# Patient Record
Sex: Male | Born: 1945
Health system: Southern US, Community
[De-identification: ages and names within clinical notes are randomized; demographics above are authoritative.]

## PROBLEM LIST (undated history)

## (undated) DIAGNOSIS — K219 Gastro-esophageal reflux disease without esophagitis: Secondary | ICD-10-CM

## (undated) DIAGNOSIS — F419 Anxiety disorder, unspecified: Secondary | ICD-10-CM

## (undated) DIAGNOSIS — E119 Type 2 diabetes mellitus without complications: Secondary | ICD-10-CM

## (undated) DIAGNOSIS — E7800 Pure hypercholesterolemia, unspecified: Secondary | ICD-10-CM

## (undated) DIAGNOSIS — I10 Essential (primary) hypertension: Secondary | ICD-10-CM

## (undated) HISTORY — DX: Essential (primary) hypertension: I10

## (undated) HISTORY — DX: Gastro-esophageal reflux disease without esophagitis: K21.9

## (undated) HISTORY — DX: Pure hypercholesterolemia, unspecified: E78.00

## (undated) HISTORY — DX: Anxiety disorder, unspecified: F41.9

## (undated) HISTORY — PX: TESTICULAR EXPLORATION: SHX5145

## (undated) HISTORY — DX: Type 2 diabetes mellitus without complications: E11.9

---

## 2014-04-23 DIAGNOSIS — H269 Unspecified cataract: Secondary | ICD-10-CM | POA: Insufficient documentation

## 2017-07-28 LAB — HM COLONOSCOPY

## 2019-05-04 LAB — HM DIABETES EYE EXAM

## 2020-03-18 ENCOUNTER — Other Ambulatory Visit: Payer: Self-pay

## 2020-03-19 ENCOUNTER — Ambulatory Visit (INDEPENDENT_AMBULATORY_CARE_PROVIDER_SITE_OTHER): Payer: Medicare (Managed Care) | Admitting: Internal Medicine

## 2020-03-19 ENCOUNTER — Encounter: Payer: Self-pay | Admitting: Internal Medicine

## 2020-03-19 VITALS — BP 140/78 | HR 60 | Temp 98.2°F | Ht 74.0 in | Wt 193.6 lb

## 2020-03-19 DIAGNOSIS — Z23 Encounter for immunization: Secondary | ICD-10-CM

## 2020-03-19 DIAGNOSIS — B351 Tinea unguium: Secondary | ICD-10-CM | POA: Insufficient documentation

## 2020-03-19 DIAGNOSIS — E119 Type 2 diabetes mellitus without complications: Secondary | ICD-10-CM

## 2020-03-19 DIAGNOSIS — I10 Essential (primary) hypertension: Secondary | ICD-10-CM

## 2020-03-19 DIAGNOSIS — E785 Hyperlipidemia, unspecified: Secondary | ICD-10-CM | POA: Diagnosis not present

## 2020-03-19 DIAGNOSIS — D539 Nutritional anemia, unspecified: Secondary | ICD-10-CM

## 2020-03-19 DIAGNOSIS — N400 Enlarged prostate without lower urinary tract symptoms: Secondary | ICD-10-CM | POA: Diagnosis not present

## 2020-03-19 DIAGNOSIS — E538 Deficiency of other specified B group vitamins: Secondary | ICD-10-CM | POA: Diagnosis not present

## 2020-03-19 DIAGNOSIS — N62 Hypertrophy of breast: Secondary | ICD-10-CM | POA: Insufficient documentation

## 2020-03-19 DIAGNOSIS — D508 Other iron deficiency anemias: Secondary | ICD-10-CM

## 2020-03-19 DIAGNOSIS — R972 Elevated prostate specific antigen [PSA]: Secondary | ICD-10-CM

## 2020-03-19 LAB — FOLATE: Folate: 8.8 ng/mL (ref >5.9)

## 2020-03-19 LAB — TSH: TSH: 1.43 u[IU]/mL (ref 0.35–4.50)

## 2020-03-19 LAB — HEPATIC FUNCTION PANEL
ALT: 7 U/L (ref 0–53)
AST: 9 U/L (ref 0–37)
Albumin: 4.2 g/dL (ref 3.5–5.2)
Alkaline Phosphatase: 76 U/L (ref 39–117)
Bilirubin, Direct: 0.1 mg/dL (ref 0.0–0.3)
Total Bilirubin: 0.6 mg/dL (ref 0.2–1.2)
Total Protein: 7.6 g/dL (ref 6.0–8.3)

## 2020-03-19 LAB — CBC WITH DIFFERENTIAL/PLATELET
Basophils Absolute: 0.1 10*3/uL (ref 0.0–0.1)
Basophils Relative: 0.7 % (ref 0.0–3.0)
Eosinophils Absolute: 0.1 10*3/uL (ref 0.0–0.7)
Eosinophils Relative: 1.5 % (ref 0.0–5.0)
HCT: 36.1 % — ABNORMAL LOW (ref 39.0–52.0)
Hemoglobin: 12.3 g/dL — ABNORMAL LOW (ref 13.0–17.0)
Lymphocytes Relative: 29.2 % (ref 12.0–46.0)
Lymphs Abs: 2.3 10*3/uL (ref 0.7–4.0)
MCHC: 33.9 g/dL (ref 30.0–36.0)
MCV: 89.8 fl (ref 78.0–100.0)
Monocytes Absolute: 0.7 10*3/uL (ref 0.1–1.0)
Monocytes Relative: 8.7 % (ref 3.0–12.0)
Neutro Abs: 4.8 10*3/uL (ref 1.4–7.7)
Neutrophils Relative %: 59.9 % (ref 43.0–77.0)
Platelets: 258 10*3/uL (ref 150.0–400.0)
RBC: 4.02 Mil/uL — ABNORMAL LOW (ref 4.22–5.81)
RDW: 14.1 % (ref 11.5–15.5)
WBC: 8 10*3/uL (ref 4.0–10.5)

## 2020-03-19 LAB — BASIC METABOLIC PANEL
BUN: 11 mg/dL (ref 6–23)
CO2: 32 mEq/L (ref 19–32)
Calcium: 9.3 mg/dL (ref 8.4–10.5)
Chloride: 100 mEq/L (ref 96–112)
Creatinine, Ser: 0.79 mg/dL (ref 0.40–1.50)
GFR: 88.53 mL/min (ref >60.00)
Glucose, Bld: 166 mg/dL — ABNORMAL HIGH (ref 70–99)
Potassium: 3.5 mEq/L (ref 3.5–5.1)
Sodium: 138 mEq/L (ref 135–145)

## 2020-03-19 LAB — LIPID PANEL
Cholesterol: 152 mg/dL (ref 0–200)
HDL: 62.5 mg/dL (ref >39.00)
LDL Cholesterol: 69 mg/dL (ref 0–99)
NonHDL: 89.02
Total CHOL/HDL Ratio: 2
Triglycerides: 101 mg/dL (ref 0.0–149.0)
VLDL: 20.2 mg/dL (ref 0.0–40.0)

## 2020-03-19 LAB — MICROALBUMIN / CREATININE URINE RATIO
Creatinine,U: 228.4 mg/dL
Microalb Creat Ratio: 1.1 mg/g (ref 0.0–30.0)
Microalb, Ur: 2.5 mg/dL — ABNORMAL HIGH (ref 0.0–1.9)

## 2020-03-19 LAB — PSA: PSA: 2.8

## 2020-03-19 LAB — VITAMIN B12: Vitamin B-12: 519 pg/mL (ref 211–911)

## 2020-03-19 LAB — HEMOGLOBIN A1C: Hgb A1c MFr Bld: 8 % — ABNORMAL HIGH (ref 4.6–6.5)

## 2020-03-19 NOTE — Progress Notes (Signed)
Subjective:  Patient ID: Stephen Factor., male    DOB: 06/20/1945  Age: 74 y.o. MRN: [id number]  CC: Hypertension, Hyperlipidemia, and Diabetes  This visit occurred during the SARS-CoV-2 public health emergency.  Safety protocols were in place, including screening questions prior to the visit, additional usage of staff PPE, and extensive cleaning of exam room while observing appropriate contact time as indicated for disinfecting solutions.   NEW TO ME  HPI Stephen Gray. presents for establishing care.  He recently moved to [place] from [place] to be near his daughter.  1.  He complains of a 36-month history of breast tenderness and enlargement.  2.  He tells me that he has a history of an elevated PSA and has nocturia about 3-4 times a night.  He tells me his urine flow is good during the day.  3.  He recently traumatized his right great toenail and remains concerned about it.  4.  He has a history of DM 2.  He is currently taking Metformin and a DPP 4 inhibitor.  5.  He reports a history of B12 deficiency.  He is not currently taking a B12 supplement.  He has no medical records with him.  No medical records are available to me.  History Stephen Gray has a past medical history of Diabetes (HCC), Hypercholesteremia, and Hypertension.   He has no past surgical history on file.   His family history is not on file.He reports that he has quit smoking. He has never used smokeless tobacco. He reports previous alcohol use. He reports previous drug use.  Outpatient Medications Prior to Visit  Medication Sig Dispense Refill  . Cholecalciferol (VITAMIN D-3) 25 MCG (1000 UT) CAPS Take by mouth.    . Multiple Vitamin (MULTIVITAMIN ADULT PO) Take by mouth.    Marland Kitchen SIMVASTATIN PO Take by mouth.    . tamsulosin (FLOMAX) 0.4 MG CAPS capsule Take 0.4 mg by mouth.    . VALSARTAN PO Take by mouth.    . metFORMIN (GLUCOPHAGE) 1000 MG tablet Take 1,000 mg by mouth 2 (two) times daily with a  meal.    . sitaGLIPtin (JANUVIA) 50 MG tablet Take 50 mg by mouth daily.     No facility-administered medications prior to visit.    ROS Review of Systems  Constitutional: Negative for appetite change, diaphoresis, fatigue and fever.  HENT: Negative.   Eyes: Negative for visual disturbance.  Respiratory: Negative for cough, chest tightness, shortness of breath and wheezing.   Cardiovascular: Negative for chest pain, palpitations and leg swelling.  Gastrointestinal: Negative for abdominal pain, constipation, diarrhea, nausea and vomiting.  Endocrine: Negative for polydipsia, polyphagia and polyuria.  Genitourinary: Negative.  Negative for difficulty urinating, dysuria, flank pain, hematuria, penile swelling, scrotal swelling, testicular pain and urgency.  Musculoskeletal: Negative.  Negative for arthralgias and myalgias.  Skin: Negative.  Negative for color change, pallor and rash.  Neurological: Negative.  Negative for dizziness, weakness, light-headedness and headaches.  Hematological: Negative for adenopathy. Does not bruise/bleed easily.  Psychiatric/Behavioral: Negative.     Objective:  BP 140/78 (BP Location: Right Arm, Patient Position: Sitting, Cuff Size: Normal)   Pulse 60   Temp 98.2 F (36.8 C) (Oral)   Ht 6\' 2"  (1.88 m)   Wt 193 lb 9.6 oz (87.8 kg)   SpO2 96%   BMI 24.86 kg/m   Physical Exam Vitals reviewed. Exam conducted with a chaperone present.  Constitutional:      Appearance: Normal appearance.  HENT:     Nose: Nose normal.     Mouth/Throat:     Mouth: Mucous membranes are moist.  Eyes:     General: No scleral icterus.    Conjunctiva/sclera: Conjunctivae normal.  Cardiovascular:     Rate and Rhythm: Regular rhythm. Bradycardia present.     Pulses:          Dorsalis pedis pulses are 1+ on the right side and 1+ on the left side.       Posterior tibial pulses are 1+ on the right side and 1+ on the left side.     Heart sounds: Normal heart sounds, S1  normal and S2 normal. No murmur heard. No gallop.      Comments: Sinus bradycardia, 53 bpm NS T wave changes No old EKG for comparison No Q waves or LVH Pulmonary:     Effort: Pulmonary effort is normal.     Breath sounds: No stridor. No wheezing, rhonchi or rales.  Chest:     Chest wall: No mass.  Breasts: Breasts are symmetrical.     Right: No swelling, bleeding, inverted nipple, mass, nipple discharge, skin change, tenderness, axillary adenopathy or supraclavicular adenopathy.     Left: No swelling, bleeding, inverted nipple, mass, nipple discharge, skin change, tenderness, axillary adenopathy or supraclavicular adenopathy.        Comments: ++ Gynecomastia Abdominal:     General: Abdomen is flat. Bowel sounds are normal. There is no distension.     Palpations: Abdomen is soft. There is no hepatomegaly, splenomegaly or mass.     Tenderness: There is no abdominal tenderness.     Hernia: No hernia is present. There is no hernia in the left inguinal area or right inguinal area.  Genitourinary:    Pubic Area: No rash.      Penis: Normal. No discharge, swelling or lesions.      Testes: Normal.        Right: Mass, tenderness or swelling not present.        Left: Mass, tenderness or swelling not present.     Epididymis:     Right: Normal. Not inflamed or enlarged.     Left: Normal. Not inflamed or enlarged.     Prostate: Enlarged (1+ smooth symm BPH). Not tender and no nodules present.     Rectum: Normal. Guaiac result negative. No mass, tenderness, anal fissure, external hemorrhoid or internal hemorrhoid. Normal anal tone.  Musculoskeletal:     Cervical back: Neck supple.     Right lower leg: No edema.     Left lower leg: No edema.  Feet:     Right foot:     Skin integrity: Skin integrity normal.     Toenail Condition: Right toenails are abnormally thick and long. Fungal disease present.    Left foot:     Skin integrity: Skin integrity normal.     Toenail Condition: Left  toenails are normal.     Comments: Rt great toenail has an accumulation of blood under the nail, the nail is thick and there is subungual debris Lymphadenopathy:     Cervical: No cervical adenopathy.     Upper Body:     Right upper body: No supraclavicular, axillary or pectoral adenopathy.     Left upper body: No supraclavicular, axillary or pectoral adenopathy.     Lower Body: No right inguinal adenopathy. No left inguinal adenopathy.  Skin:    General: Skin is warm and dry.     Coloration:  Skin is not pale.     Findings: No rash.  Neurological:     General: No focal deficit present.     Mental Status: He is alert and oriented to person, place, and time. Mental status is at baseline.  Psychiatric:        Mood and Affect: Mood normal.     Lab Results  Component Value Date   WBC 8.0 03/19/2020   HGB 12.3 (L) 03/19/2020   HCT 36.1 (L) 03/19/2020   PLT 258.0 03/19/2020   GLUCOSE 166 (H) 03/19/2020   CHOL 152 03/19/2020   TRIG 101.0 03/19/2020   HDL 62.50 03/19/2020   LDLCALC 69 03/19/2020   ALT 7 03/19/2020   AST 9 03/19/2020   NA 138 03/19/2020   K 3.5 03/19/2020   CL 100 03/19/2020   CREATININE 0.79 03/19/2020   BUN 11 03/19/2020   CO2 32 03/19/2020   TSH 1.43 03/19/2020   PSA 2.8 03/19/2020   HGBA1C 8.0 (H) 03/19/2020   MICROALBUR 2.5 (H) 03/19/2020    Assessment & Plan:   Zamire was seen today for hypertension, hyperlipidemia and diabetes.  Diagnoses and all orders for this visit:  Type 2 diabetes mellitus without complication, without long-term current use of insulin (HCC)- His blood sugar is not adequately well controlled with an A1c of 8.0%.  I recommended that he transition from the DPP 4 inhibitor to an SGLT2 inhibitor and to stay on Metformin. -     Basic metabolic panel; Future -     Microalbumin / creatinine urine ratio; Future -     Hemoglobin A1c; Future -     HM Diabetes Foot Exam -     Hemoglobin A1c -     Microalbumin / creatinine urine ratio -      Basic metabolic panel -     Dapagliflozin-metFORMIN HCl ER (XIGDUO XR) 12-998 MG TB24; Take 1 tablet by mouth daily.  Primary hypertension- His blood pressure is adequately well controlled.  Electrolytes and renal function are normal. -     CBC with Differential/Platelet; Future -     Basic metabolic panel; Future -     TSH; Future -     Urinalysis, Routine w reflex microscopic; Future -     Urinalysis, Routine w reflex microscopic -     TSH -     Basic metabolic panel -     CBC with Differential/Platelet  Hyperlipidemia with target LDL less than 100- He has achieved his LDL goal and is doing well on the statin. -     Lipid panel; Future -     Hepatic function panel; Future -     TSH; Future -     TSH -     Hepatic function panel -     Lipid panel  Benign prostatic hyperplasia without lower urinary tract symptoms- His PSA is normal and he has no symptoms that need to be treated. -     Cancel: PSA; Future -     Urinalysis, Routine w reflex microscopic; Future -     PSA, total and free; Future -     PSA, total and free -     Urinalysis, Routine w reflex microscopic  PSA elevation- His PSA is normal now. -     PSA, total and free; Future -     PSA, total and free  Need for pneumococcal vaccine -     Pneumococcal polysaccharide vaccine 23-valent greater than or equal to 2yo  subcutaneous/IM  Onychomycosis of right great toe -     Ambulatory referral to Podiatry  Subareolar gynecomastia in male -     Ambulatory referral to Endocrinology  B12 deficiency due to diet- He is anemic but his B12 and folate levels are normal. -     Vitamin B12; Future -     Folate; Future -     Folate -     Vitamin B12  Deficiency anemia- I will screen him for vitamin deficiencies. -     Iron; Future -     Ferritin; Future -     Vitamin B1; Future -     Reticulocytes; Future  Other iron deficiency anemia- His iron level is low.  I recommended that he see GI to be screened for GI sources of  blood loss.  I have also recommended that he receive a series of iron infusions. -     Ambulatory referral to Gastroenterology   I have discontinued Quita Skye. Albright Jr.'s metFORMIN and sitaGLIPtin. I am also having him start on Xigduo XR. Additionally, I am having him maintain his VALSARTAN PO, Vitamin D-3, Multiple Vitamin (MULTIVITAMIN ADULT PO), SIMVASTATIN PO, and tamsulosin.  Meds ordered this encounter  Medications  . Dapagliflozin-metFORMIN HCl ER (XIGDUO XR) 12-998 MG TB24    Sig: Take 1 tablet by mouth daily.    Dispense:  90 tablet    Refill:  1   I spent 50 minutes in preparing to see the patient by review of recent labs, imaging and procedures, obtaining and reviewing separately obtained history, communicating with the patient and family or caregiver, ordering medications, tests or procedures, and documenting clinical information in the EHR including the differential Dx, treatment, and any further evaluation and other management of 1. Type 2 diabetes mellitus without complication, without long-term current use of insulin (HCC) 2. Primary hypertension 3. Hyperlipidemia with target LDL less than 100 4. Benign prostatic hyperplasia without lower urinary tract symptoms 5. PSA elevation 6. Onychomycosis of right great toe 7. Subareolar gynecomastia in male 8. B12 deficiency due to diet 9. Deficiency anemia 10. Other iron deficiency anemia    Follow-up: Return in about 6 months (around 09/17/2020).  Sanda Linger, MD

## 2020-03-19 NOTE — Patient Instructions (Signed)
Type 2 Diabetes Mellitus, Diagnosis, Adult Type 2 diabetes (type 2 diabetes mellitus) is a long-term (chronic) disease. In type 2 diabetes, one or both of these problems may be present:  The pancreas does not make enough of a hormone called insulin.  Cells in the body do not respond properly to insulin that the body makes (insulin resistance). Normally, insulin allows blood sugar (glucose) to enter cells in the body. The cells use glucose for energy. Insulin resistance or lack of insulin causes excess glucose to build up in the blood instead of going into cells. As a result, high blood glucose (hyperglycemia) develops. What increases the risk? The following factors may make you more likely to develop type 2 diabetes:  Having a family member with type 2 diabetes.  Being overweight or obese.  Having an inactive (sedentary) lifestyle.  Having been diagnosed with insulin resistance.  Having a history of prediabetes, gestational diabetes, or polycystic ovary syndrome (PCOS).  Being of American-Indian, African-American, Hispanic/Latino, or Asian/Pacific Islander descent. What are the signs or symptoms? In the early stage of this condition, you may not have symptoms. Symptoms develop slowly and may include:  Increased thirst (polydipsia).  Increased hunger(polyphagia).  Increased urination (polyuria).  Increased urination during the night (nocturia).  Unexplained weight loss.  Frequent infections that keep coming back (recurring).  Fatigue.  Weakness.  Vision changes, such as blurry vision.  Cuts or bruises that are slow to heal.  Tingling or numbness in the hands or feet.  Dark patches on the skin (acanthosis nigricans). How is this diagnosed? This condition is diagnosed based on your symptoms, your medical history, a physical exam, and your blood glucose level. Your blood glucose may be checked with one or more of the following blood tests:  A fasting blood glucose (FBG)  test. You will not be allowed to eat (you will fast) for 8 hours or longer before a blood sample is taken.  A random blood glucose test. This test checks blood glucose at any time of day regardless of when you ate.  An A1c (hemoglobin A1c) blood test. This test provides information about blood glucose control over the previous 2-3 months.  An oral glucose tolerance test (OGTT). This test measures your blood glucose at two times: ? After fasting. This is your baseline blood glucose level. ? Two hours after drinking a beverage that contains glucose. You may be diagnosed with type 2 diabetes if:  Your FBG level is 126 mg/dL (7.0 mmol/L) or higher.  Your random blood glucose level is 200 mg/dL (11.1 mmol/L) or higher.  Your A1c level is 6.5% or higher.  Your OGTT result is higher than 200 mg/dL (11.1 mmol/L). These blood tests may be repeated to confirm your diagnosis. How is this treated? Your treatment may be managed by a specialist called an endocrinologist. Type 2 diabetes may be treated by following instructions from your health care provider about:  Making diet and lifestyle changes. This may include: ? Following an individualized nutrition plan that is developed by a diet and nutrition specialist (registered dietitian). ? Exercising regularly. ? Finding ways to manage stress.  Checking your blood glucose level as often as told.  Taking diabetes medicines or insulin daily. This helps to keep your blood glucose levels in the healthy range. ? If you use insulin, you may need to adjust the dosage depending on how physically active you are and what foods you eat. Your health care provider will tell you how to adjust your dosage.    Taking medicines to help prevent complications from diabetes, such as: ? Aspirin. ? Medicine to lower cholesterol. ? Medicine to control blood pressure. Your health care provider will set individualized treatment goals for you. Your goals will be based on  your age, other medical conditions you have, and how you respond to diabetes treatment. Generally, the goal of treatment is to maintain the following blood glucose levels:  Before meals (preprandial): 80-130 mg/dL (4.4-7.2 mmol/L).  After meals (postprandial): below 180 mg/dL (10 mmol/L).  A1c level: less than 7%. Follow these instructions at home: Questions to ask your health care provider  Consider asking the following questions: ? Do I need to meet with a diabetes educator? ? Where can I find a support group for people with diabetes? ? What equipment will I need to manage my diabetes at home? ? What diabetes medicines do I need, and when should I take them? ? How often do I need to check my blood glucose? ? What number can I call if I have questions? ? When is my next appointment? General instructions  Take over-the-counter and prescription medicines only as told by your health care provider.  Keep all follow-up visits as told by your health care provider. This is important.  For more information about diabetes, visit: ? American Diabetes Association (ADA): www.diabetes.org ? American Association of Diabetes Educators (AADE): www.diabeteseducator.org Contact a health care provider if:  Your blood glucose is at or above 240 mg/dL (13.3 mmol/L) for 2 days in a row.  You have been sick or have had a fever for 2 days or longer, and you are not getting better.  You have any of the following problems for more than 6 hours: ? You cannot eat or drink. ? You have nausea and vomiting. ? You have diarrhea. Get help right away if:  Your blood glucose is lower than 54 mg/dL (3.0 mmol/L).  You become confused or you have trouble thinking clearly.  You have difficulty breathing.  You have moderate or large ketone levels in your urine. Summary  Type 2 diabetes (type 2 diabetes mellitus) is a long-term (chronic) disease. In type 2 diabetes, the pancreas does not make enough of a  hormone called insulin, or cells in the body do not respond properly to insulin that the body makes (insulin resistance).  This condition is treated by making diet and lifestyle changes and taking diabetes medicines or insulin.  Your health care provider will set individualized treatment goals for you. Your goals will be based on your age, other medical conditions you have, and how you respond to diabetes treatment.  Keep all follow-up visits as told by your health care provider. This is important. This information is not intended to replace advice given to you by your health care provider. Make sure you discuss any questions you have with your health care provider. Document Revised: 05/07/2017 Document Reviewed: 04/12/2015 Elsevier Patient Education  2020 [employer]  

## 2020-03-20 ENCOUNTER — Other Ambulatory Visit (INDEPENDENT_AMBULATORY_CARE_PROVIDER_SITE_OTHER): Payer: Medicare (Managed Care)

## 2020-03-20 ENCOUNTER — Other Ambulatory Visit: Payer: Self-pay

## 2020-03-20 ENCOUNTER — Encounter: Payer: Self-pay | Admitting: Internal Medicine

## 2020-03-20 DIAGNOSIS — D539 Nutritional anemia, unspecified: Secondary | ICD-10-CM

## 2020-03-20 LAB — URINALYSIS, ROUTINE W REFLEX MICROSCOPIC
Bilirubin Urine: NEGATIVE
Hgb urine dipstick: NEGATIVE
Leukocytes,Ua: NEGATIVE
Nitrite: NEGATIVE
RBC / HPF: NONE SEEN (ref 0–?)
Specific Gravity, Urine: >=1.03 — AB (ref 1.000–1.030)
Total Protein, Urine: NEGATIVE
Urine Glucose: NEGATIVE
Urobilinogen, UA: 0.2 (ref 0.0–1.0)
pH: 5 (ref 5.0–8.0)

## 2020-03-20 LAB — PSA, TOTAL AND FREE
PSA, % Free: 29 % (calc) (ref >25)
PSA, Free: 0.8 ng/mL
PSA, Total: 2.8 ng/mL (ref <=4.0)

## 2020-03-20 LAB — FERRITIN: Ferritin: 20.4 ng/mL — ABNORMAL LOW (ref 22.0–322.0)

## 2020-03-20 LAB — IRON: Iron: 39 ug/dL — ABNORMAL LOW (ref 42–165)

## 2020-03-20 LAB — RETICULOCYTES
ABS Retic: 60800 cells/uL (ref 25000–9000)
Retic Ct Pct: 1.6 %

## 2020-03-20 MED ORDER — XIGDUO XR 10-1000 MG PO TB24: 1.0000 | ORAL_TABLET | Freq: Every day | ORAL | 1 refills | Status: DC

## 2020-03-24 LAB — VITAMIN B1: Vitamin B1 (Thiamine): 42 nmol/L — ABNORMAL HIGH (ref 8–30)

## 2020-03-25 ENCOUNTER — Other Ambulatory Visit: Payer: Self-pay | Admitting: Internal Medicine

## 2020-03-25 ENCOUNTER — Other Ambulatory Visit: Payer: Self-pay

## 2020-03-25 DIAGNOSIS — N138 Other obstructive and reflux uropathy: Secondary | ICD-10-CM

## 2020-03-25 DIAGNOSIS — E785 Hyperlipidemia, unspecified: Secondary | ICD-10-CM

## 2020-03-25 DIAGNOSIS — I10 Essential (primary) hypertension: Secondary | ICD-10-CM

## 2020-03-25 DIAGNOSIS — E119 Type 2 diabetes mellitus without complications: Secondary | ICD-10-CM

## 2020-03-25 MED ORDER — TAMSULOSIN HCL 0.4 MG PO CAPS: 0.4000 mg | ORAL_CAPSULE | Freq: Every day | ORAL | 1 refills | Status: DC

## 2020-03-25 MED ORDER — SIMVASTATIN 40 MG PO TABS: 40.0000 mg | ORAL_TABLET | Freq: Every day | ORAL | 1 refills | Status: DC

## 2020-03-25 MED ORDER — VALSARTAN-HYDROCHLOROTHIAZIDE 320-25 MG PO TABS: 1.0000 | ORAL_TABLET | Freq: Every day | ORAL | 1 refills | Status: DC

## 2020-03-25 MED ORDER — AMLODIPINE BESYLATE 5 MG PO TABS: 5.0000 mg | ORAL_TABLET | Freq: Every day | ORAL | 1 refills | Status: DC

## 2020-03-28 NOTE — Addendum Note (Signed)
Addended by: Darryll Capers on: 03/28/2020 02:09 PM   Modules accepted: Orders

## 2020-03-29 ENCOUNTER — Ambulatory Visit: Payer: Medicare (Managed Care) | Admitting: Podiatry

## 2020-03-29 ENCOUNTER — Encounter: Payer: Self-pay | Admitting: Internal Medicine

## 2020-04-01 ENCOUNTER — Ambulatory Visit: Payer: Medicare (Managed Care) | Admitting: Cardiology

## 2020-04-01 ENCOUNTER — Other Ambulatory Visit: Payer: Self-pay

## 2020-04-01 ENCOUNTER — Other Ambulatory Visit: Payer: Self-pay | Admitting: Internal Medicine

## 2020-04-01 ENCOUNTER — Encounter: Payer: Self-pay | Admitting: Cardiology

## 2020-04-01 VITALS — BP 143/73 | HR 90 | Temp 98.0°F | Ht 74.0 in | Wt 193.0 lb

## 2020-04-01 DIAGNOSIS — I10 Essential (primary) hypertension: Secondary | ICD-10-CM

## 2020-04-01 DIAGNOSIS — R001 Bradycardia, unspecified: Secondary | ICD-10-CM

## 2020-04-01 DIAGNOSIS — R9431 Abnormal electrocardiogram [ECG] [EKG]: Secondary | ICD-10-CM

## 2020-04-01 DIAGNOSIS — E119 Type 2 diabetes mellitus without complications: Secondary | ICD-10-CM

## 2020-04-01 MED ORDER — VALSARTAN-HYDROCHLOROTHIAZIDE 320-25 MG PO TABS: 1.0000 | ORAL_TABLET | Freq: Every morning | ORAL | 1 refills | Status: DC

## 2020-04-01 MED ORDER — AMLODIPINE BESYLATE 5 MG PO TABS: 5.0000 mg | ORAL_TABLET | Freq: Every evening | ORAL | 0 refills | Status: DC

## 2020-04-01 NOTE — Progress Notes (Signed)
Date:  04/01/2020   ID:  Stephen Gray., DOB [birth year], MRN [id number]  PCP:  Etta Grandchild, MD  Cardiologist:  Tessa Lerner, DO, [facility] (established care 04/01/2020)  REASON FOR CONSULT: Bradycardia   REQUESTING PHYSICIAN:  Etta Grandchild, MD [address] [place],  [place] [address]  Chief Complaint  Patient presents with  . Bradycardia    HPI  Stephen Gray is a 75 y.o. male who presents to the office with a chief complaint of " bradycardia evaluation." Patient's past medical history and cardiovascular risk factors include: Hypertension, Hyperlipidemia, NIDDM, Vitamin B12 deficiency, former smoker, advanced age.  He is referred to the office at the request of Etta Grandchild, MD for evaluation of tachycardia.  Patient is accompanied by his daughter Stephen Gray at today's office visit.  Patient provides verbal consent in regards to having her present during today's encounter.  In December 2021 patient moved from [place] to [place] to be closer with kids and recently establish care with PCP given his chronic comorbid conditions.  During that encounter an EKG was performed and interpreted as abnormal given the sinus bradycardia and nonspecific T wave abnormalities.  He is referred to cardiology for further evaluation and management.  Clinically patient states that he is doing well from a cardiovascular standpoint he does not have any chest pain or shortness of breath at rest or with effort related activities.  Patient states that he is pretty active for his age but does not participate in any exercise program or daily routine.  He does not complain of feeling tired or fatigued.  He denies any shortness of breath, lightheadedness, dizziness, near syncope or syncope.  Patient states that he has had 2 episodes of what he describes vasovagal syncope feeling hot, flushed, diaphoresis while he was in [place] and last episode was more than 6 to 7 months ago.  No  identifiable reversible causes for bradycardia.  TSH within normal limits.  Last hemoglobin is 12.3 g/dL.  FUNCTIONAL STATUS: Active for his age with ADLs and doing daily chores.  However, no structured exercise program or daily routine.  ALLERGIES: Allergies  Allergen Reactions  . Shrimp Flavor Anaphylaxis, Hives, Shortness Of Breath, Itching, Nausea And Vomiting, Swelling and Rash    MEDICATION LIST PRIOR TO VISIT: Current Meds  Medication Sig  . Cholecalciferol (VITAMIN D-3) 25 MCG (1000 UT) CAPS Take by mouth.  . Dapagliflozin-metFORMIN HCl ER (XIGDUO XR) 12-998 MG TB24 Take 1 tablet by mouth daily.  . Multiple Vitamin (MULTIVITAMIN ADULT PO) Take by mouth.  . simvastatin (ZOCOR) 40 MG tablet Take 1 tablet (40 mg total) by mouth at bedtime.  . tamsulosin (FLOMAX) 0.4 MG CAPS capsule Take 1 capsule (0.4 mg total) by mouth daily after supper.  . [DISCONTINUED] amLODipine (NORVASC) 5 MG tablet Take 1 tablet (5 mg total) by mouth daily.  . [DISCONTINUED] valsartan-hydrochlorothiazide (DIOVAN-HCT) 320-25 MG tablet Take 1 tablet by mouth daily.     PAST MEDICAL HISTORY: Past Medical History:  Diagnosis Date  . Diabetes (HCC)   . Hypercholesteremia   . Hypertension     PAST SURGICAL HISTORY: Past Surgical History:  Procedure Laterality Date  . TESTICULAR EXPLORATION      FAMILY HISTORY: The patient family history includes Diabetes in his mother; Kidney disease in his father.  SOCIAL HISTORY:  The patient  reports that he has quit smoking. He has never used smokeless tobacco. He reports previous alcohol use. He reports  previous drug use.  REVIEW OF SYSTEMS: Review of Systems  Constitutional: Negative for chills and fever.  HENT: Positive for hearing loss (decreased hearing, per patient. ). Negative for hoarse voice and nosebleeds.   Eyes: Positive for blurred vision (cataract right eye). Negative for discharge, double vision and pain.  Cardiovascular: Negative for chest  pain, claudication, dyspnea on exertion, leg swelling, near-syncope, orthopnea, palpitations, paroxysmal nocturnal dyspnea and syncope.  Respiratory: Negative for hemoptysis and shortness of breath.   Musculoskeletal: Negative for muscle cramps and myalgias.  Gastrointestinal: Negative for abdominal pain, constipation, diarrhea, hematemesis, hematochezia, melena, nausea and vomiting.  Neurological: Negative for dizziness and light-headedness.    PHYSICAL EXAM: Vitals with BMI 04/01/2020 03/19/2020  Height 6\' 2"  6\' 2"   Weight 193 lbs 193 lbs 10 oz  BMI 24.77 24.85  Systolic 143 140  Diastolic 73 78  Pulse 90 60   CONSTITUTIONAL: Well-developed and well-nourished. No acute distress.  SKIN: Skin is warm and dry. No rash noted. No cyanosis. No pallor. No jaundice HEAD: Normocephalic and atraumatic.  EYES: No scleral icterus MOUTH/THROAT: Moist oral membranes.   NECK: No JVD present. No thyromegaly noted. No carotid bruits  LYMPHATIC: No visible cervical adenopathy.  CHEST Normal respiratory effort. No intercostal retractions  LUNGS: Clear to auscultation bilaterally. No stridor. No wheezes. No rales.  CARDIOVASCULAR: Regular rate and rhythm, positive S1-S2, no murmurs rubs or gallops appreciated. ABDOMINAL: No apparent ascites.  EXTREMITIES: No peripheral edema  HEMATOLOGIC: No significant bruising NEUROLOGIC: Oriented to person, place, and time. Nonfocal. Normal muscle tone.  PSYCHIATRIC: Normal mood and affect. Normal behavior. Cooperative  CARDIAC DATABASE: EKG: 04/01/2020: Normal sinus rhythm, 73 bpm, nonspecific T wave normality, without underlying ischemia or injury pattern.  Echocardiogram: No results found for this or any previous visit from the past 1095 days.  Stress Testing: No results found for this or any previous visit from the past 1095 days.  Heart Catheterization: None  LABORATORY DATA: CBC Latest Ref Rng & Units 03/19/2020  WBC 4.0 - 10.5 K/uL 8.0   Hemoglobin 13.0 - 17.0 g/dL 12.3(L)  Hematocrit 39.0 - 52.0 % 36.1(L)  Platelets 150.0 - 400.0 K/uL 258.0    CMP Latest Ref Rng & Units 03/19/2020  Glucose 70 - 99 mg/dL 03/21/2020)  BUN 6 - 23 mg/dL 11  Creatinine 03/21/2020 - 416(S mg/dL 0.63  Sodium 0.16 - 0.10 mEq/L 138  Potassium 3.5 - 5.1 mEq/L 3.5  Chloride 96 - 112 mEq/L 100  CO2 19 - 32 mEq/L 32  Calcium 8.4 - 10.5 mg/dL 9.3  Total Protein 6.0 - 8.3 g/dL 7.6  Total Bilirubin 0.2 - 1.2 mg/dL 0.6  Alkaline Phos 39 - 117 U/L 76  AST 0 - 37 U/L 9  ALT 0 - 53 U/L 7    Lipid Panel     Component Value Date/Time   CHOL 152 03/19/2020 1525   TRIG 101.0 03/19/2020 1525   HDL 62.50 03/19/2020 1525   CHOLHDL 2 03/19/2020 1525   VLDL 20.2 03/19/2020 1525   LDLCALC 69 03/19/2020 1525    No components found for: NTPROBNP No results for input(s): PROBNP in the last 8760 hours. Recent Labs    03/19/20 1525  TSH 1.43    BMP Recent Labs    03/19/20 1525  NA 138  K 3.5  CL 100  CO2 32  GLUCOSE 166*  BUN 11  CREATININE 0.79  CALCIUM 9.3    HEMOGLOBIN A1C Lab Results  Component Value Date   HGBA1C  8.0 (H) 03/19/2020    IMPRESSION:    ICD-10-CM   1. Bradycardia  R00.1 EKG 12-Lead    PCV ECHOCARDIOGRAM COMPLETE    PCV CARDIAC STRESS TEST    SARS-COV-2 RNA,(COVID-19) QUAL NAAT  2. Nonspecific abnormal electrocardiogram (ECG) (EKG)  R94.31 PCV ECHOCARDIOGRAM COMPLETE    PCV CARDIAC STRESS TEST    SARS-COV-2 RNA,(COVID-19) QUAL NAAT  3. Type 2 diabetes mellitus without complication, without long-term current use of insulin (HCC)  E11.9 valsartan-hydrochlorothiazide (DIOVAN-HCT) 320-25 MG tablet  4. Benign hypertension  I10 valsartan-hydrochlorothiazide (DIOVAN-HCT) 320-25 MG tablet  5. Primary hypertension  I10 amLODipine (NORVASC) 5 MG tablet     RECOMMENDATIONS: Hitesh Fouche. is a 75 y.o. male whose past medical history and cardiac risk factors include: Hypertension, Hyperlipidemia, NIDDM, Vitamin B12 deficiency,  former smoker, advanced age.  Bradycardia:  Patient was referred to the office for evaluation of bradycardia.  However, on vital signs as well as the EKG patient's heart rate is greater than 60 bpm.  This may have been an incidental finding.  Patient is not on any reversible causes to account for bradycardia such as AV nodal blocking agents.  TSH within normal limits.  And hemoglobin relatively stable at 12.3 g/dL.  Plan GXT to evaluate for chronotropic competence and evaluate for exercise-induced ischemia.  Echocardiogram will be ordered to evaluate for structural heart disease and left ventricular systolic function.  Benign essential hypertension:  Patient and daughter are requesting my assistance in the management of high blood pressure.  Patient is a systolic blood pressure is greater than 130 mmHg despite being a diabetic.  He is recommended to take Diovan in the morning and amlodipine at night.  Diovan refilled  Amlodipine refilled  Low-salt diet recommended.  Non-insulin-dependent diabetes mellitus type 2:  Currently managed by primary care provider.  Mixed Hyperlipidemia:  . Continue statin therapy.   . Follow lipids. . Currently managed by primary care provider. . Patient denies myalgia or other side effects.   FINAL MEDICATION LIST END OF ENCOUNTER:  Current Outpatient Medications:  .  Cholecalciferol (VITAMIN D-3) 25 MCG (1000 UT) CAPS, Take by mouth., Disp: , Rfl:  .  Dapagliflozin-metFORMIN HCl ER (XIGDUO XR) 12-998 MG TB24, Take 1 tablet by mouth daily., Disp: 90 tablet, Rfl: 1 .  Multiple Vitamin (MULTIVITAMIN ADULT PO), Take by mouth., Disp: , Rfl:  .  simvastatin (ZOCOR) 40 MG tablet, Take 1 tablet (40 mg total) by mouth at bedtime., Disp: 90 tablet, Rfl: 1 .  tamsulosin (FLOMAX) 0.4 MG CAPS capsule, Take 1 capsule (0.4 mg total) by mouth daily after supper., Disp: 90 capsule, Rfl: 1 .  amLODipine (NORVASC) 5 MG tablet, Take 1 tablet (5 mg total) by  mouth every evening., Disp: 90 tablet, Rfl: 0 .  valsartan-hydrochlorothiazide (DIOVAN-HCT) 320-25 MG tablet, Take 1 tablet by mouth in the morning., Disp: 90 tablet, Rfl: 1  Orders Placed This Encounter  Procedures  . SARS-COV-2 RNA,(COVID-19) QUAL NAAT  . PCV CARDIAC STRESS TEST  . EKG 12-Lead  . PCV ECHOCARDIOGRAM COMPLETE   There are no Patient Instructions on file for this visit.   --Continue cardiac medications as reconciled in final medication list. --Return in about 4 weeks (around 04/29/2020) for Reevaluation of Bradycardia and , Review test results. Or sooner if needed. --Continue follow-up with your primary care physician regarding the management of your other chronic comorbid conditions.  Patient's questions and concerns were addressed to his satisfaction. He voices understanding of the instructions provided  during this encounter.   This note was created using a voice recognition software as a result there may be grammatical errors inadvertently enclosed that do not reflect the nature of this encounter. Every attempt is made to correct such errors.  Rex Kras, [place], [facility]  Pager: [phone] Office: [phone]

## 2020-04-03 ENCOUNTER — Encounter: Payer: Self-pay | Admitting: Podiatry

## 2020-04-03 ENCOUNTER — Ambulatory Visit: Payer: Medicare (Managed Care) | Admitting: Podiatry

## 2020-04-03 ENCOUNTER — Other Ambulatory Visit: Payer: Self-pay

## 2020-04-03 DIAGNOSIS — M79675 Pain in left toe(s): Secondary | ICD-10-CM

## 2020-04-03 DIAGNOSIS — E1151 Type 2 diabetes mellitus with diabetic peripheral angiopathy without gangrene: Secondary | ICD-10-CM

## 2020-04-03 DIAGNOSIS — M79674 Pain in right toe(s): Secondary | ICD-10-CM

## 2020-04-03 DIAGNOSIS — B351 Tinea unguium: Secondary | ICD-10-CM | POA: Diagnosis not present

## 2020-04-03 NOTE — Progress Notes (Addendum)
Subjective:   Patient ID: Stephen Gray., male   DOB: 75 y.o.   MRN: [id number]   HPI Patient presents with elongated nails 1-5 both feet that he cannot cut and discoloration of the right big toenail with history of diabetes that he is concerned may be part of this.  Patient does not smoke    Review of Systems  All other systems reviewed and are negative.       Objective:  Physical Exam Vitals and nursing note reviewed.  Constitutional:      Appearance: He is well-developed and well-nourished.  Cardiovascular:     Pulses: Intact distal pulses.  Pulmonary:     Effort: Pulmonary effort is normal.  Musculoskeletal:        General: Normal range of motion.  Skin:    General: Skin is warm.  Neurological:     Mental Status: He is alert.     Neurovascular status was found to be intact muscle strength was found to be adequate range of motion is diminished.  He I did note pulses to be weak but they were not absent and there is good digital perfusion.  Patient has thickened deformed nailbeds 1-5 both feet that are painful dorsally and incurvated in the corners     Assessment:  Nail infection 1-5 both feet with pain along with diabetes with low-grade vascular disease     Plan:  H&P reviewed condition recommended debridement which was accomplished today with no iatrogenic bleeding.  Educated him on diabetic foot care and daily inspections and will be seen back as needed

## 2020-04-04 ENCOUNTER — Other Ambulatory Visit: Payer: Self-pay

## 2020-04-05 ENCOUNTER — Encounter: Payer: Self-pay | Admitting: Gastroenterology

## 2020-04-05 ENCOUNTER — Other Ambulatory Visit (HOSPITAL_COMMUNITY)
Admission: RE | Admit: 2020-04-05 | Discharge: 2020-04-05 | Disposition: A | Discharge status: Home / Self Care | Payer: Medicare (Managed Care) | Source: Ambulatory Visit | Attending: Cardiology | Admitting: Cardiology

## 2020-04-05 DIAGNOSIS — H527 Unspecified disorder of refraction: Secondary | ICD-10-CM | POA: Insufficient documentation

## 2020-04-05 DIAGNOSIS — H25811 Combined forms of age-related cataract, right eye: Secondary | ICD-10-CM | POA: Insufficient documentation

## 2020-04-05 DIAGNOSIS — H53012 Deprivation amblyopia, left eye: Secondary | ICD-10-CM | POA: Insufficient documentation

## 2020-04-05 DIAGNOSIS — Z01812 Encounter for preprocedural laboratory examination: Secondary | ICD-10-CM | POA: Insufficient documentation

## 2020-04-05 DIAGNOSIS — Z20822 Contact with and (suspected) exposure to covid-19: Secondary | ICD-10-CM | POA: Insufficient documentation

## 2020-04-05 DIAGNOSIS — H35102 Retinopathy of prematurity, unspecified, left eye: Secondary | ICD-10-CM | POA: Insufficient documentation

## 2020-04-05 LAB — SARS CORONAVIRUS 2 (TAT 6-24 HRS): SARS Coronavirus 2: NEGATIVE

## 2020-04-09 ENCOUNTER — Telehealth: Payer: Self-pay | Admitting: *Deleted

## 2020-04-09 NOTE — Telephone Encounter (Signed)
Patient's daughter(April Ditter)  is wanting to discuss the after visit summary notes with physician stating that what was in notes was not what was discussed in the room especially about the nails being curved inwards and that the patient is a smoker. Please return call at [phone].

## 2020-04-10 ENCOUNTER — Encounter (HOSPITAL_COMMUNITY): Payer: Medicare (Managed Care)

## 2020-04-10 NOTE — Telephone Encounter (Signed)
I corrected the smoking and did note when I was cutting the nails that they had a little bit of curve in the corners

## 2020-04-11 ENCOUNTER — Other Ambulatory Visit: Payer: Self-pay

## 2020-04-11 ENCOUNTER — Ambulatory Visit: Payer: Medicare (Managed Care)

## 2020-04-11 DIAGNOSIS — R9431 Abnormal electrocardiogram [ECG] [EKG]: Secondary | ICD-10-CM

## 2020-04-11 DIAGNOSIS — R001 Bradycardia, unspecified: Secondary | ICD-10-CM

## 2020-04-11 NOTE — Telephone Encounter (Signed)
Called to give information concerning visit note  , no answer, busy, will  Try later.

## 2020-04-12 ENCOUNTER — Ambulatory Visit: Payer: Medicare (Managed Care)

## 2020-04-12 DIAGNOSIS — R9431 Abnormal electrocardiogram [ECG] [EKG]: Secondary | ICD-10-CM

## 2020-04-12 DIAGNOSIS — R001 Bradycardia, unspecified: Secondary | ICD-10-CM

## 2020-04-17 ENCOUNTER — Ambulatory Visit (HOSPITAL_COMMUNITY)
Admission: RE | Admit: 2020-04-17 | Discharge: 2020-04-17 | Disposition: A | Discharge status: Home / Self Care | Payer: Medicare (Managed Care) | Source: Ambulatory Visit | Attending: Internal Medicine | Admitting: Internal Medicine

## 2020-04-17 ENCOUNTER — Ambulatory Visit (INDEPENDENT_AMBULATORY_CARE_PROVIDER_SITE_OTHER): Payer: Medicare (Managed Care) | Admitting: Gastroenterology

## 2020-04-17 ENCOUNTER — Other Ambulatory Visit: Payer: Self-pay

## 2020-04-17 ENCOUNTER — Encounter: Payer: Self-pay | Admitting: Gastroenterology

## 2020-04-17 VITALS — BP 108/62 | HR 64 | Ht 74.0 in | Wt 192.0 lb

## 2020-04-17 DIAGNOSIS — D508 Other iron deficiency anemias: Secondary | ICD-10-CM | POA: Insufficient documentation

## 2020-04-17 DIAGNOSIS — D509 Iron deficiency anemia, unspecified: Secondary | ICD-10-CM | POA: Diagnosis not present

## 2020-04-17 MED ORDER — SODIUM CHLORIDE 0.9 % IV SOLN
INTRAVENOUS | Status: DC | PRN
  Administered 2020-04-17: 250 mL via INTRAVENOUS

## 2020-04-17 MED ORDER — SODIUM CHLORIDE 0.9 % IV SOLN
750.0000 mg | Freq: Once | INTRAVENOUS | Status: AC
  Administered 2020-04-17: 750 mg via INTRAVENOUS
  Filled 2020-04-17: qty 15

## 2020-04-17 NOTE — Discharge Instructions (Signed)
Ferric carboxymaltose injection What is this medicine? FERRIC CARBOXYMALTOSE (ferr-ik car-box-ee-mol-toes) is an iron complex. Iron is used to make healthy red blood cells, which carry oxygen and nutrients throughout the body. This medicine is used to treat anemia in people with chronic kidney disease or people who cannot take iron by mouth. This medicine may be used for other purposes; ask your health care provider or pharmacist if you have questions. COMMON BRAND NAME(S): Injectafer What should I tell my health care provider before I take this medicine? They need to know if you have any of these conditions:  high levels of iron in the blood  liver disease  an unusual or allergic reaction to iron, other medicines, foods, dyes, or preservatives  pregnant or trying to get pregnant  breast-feeding How should I use this medicine? This medicine is for infusion into a vein. It is given by a health care professional in a hospital or clinic setting. Talk to your pediatrician regarding the use of this medicine in children. Special care may be needed. Overdosage: If you think you have taken too much of this medicine contact a poison control center or emergency room at once. NOTE: This medicine is only for you. Do not share this medicine with others. What if I miss a dose? Keep appointments for follow-up doses. It is important not to miss your dose. Call your care team if you are unable to keep an appointment. What may interact with this medicine? Do not take this medicine with any of the following medications:  deferoxamine  dimercaprol  other iron products This list may not describe all possible interactions. Give your health care provider a list of all the medicines, herbs, non-prescription drugs, or dietary supplements you use. Also tell them if you smoke, drink alcohol, or use illegal drugs. Some items may interact with your medicine. What should I watch for while using this  medicine? Visit your doctor or health care professional regularly. Tell your doctor if your symptoms do not start to get better or if they get worse. You may need blood work done while you are taking this medicine. You may need to follow a special diet. Talk to your doctor. Foods that contain iron include: whole grains/cereals, dried fruits, beans, or peas, leafy green vegetables, and organ meats (liver, kidney). What side effects may I notice from receiving this medicine? Side effects that you should report to your doctor or health care professional as soon as possible:  allergic reactions like skin rash, itching or hives, swelling of the face, lips, or tongue  dizziness  facial flushing Side effects that usually do not require medical attention (report to your doctor or health care professional if they continue or are bothersome):  changes in taste  constipation  headache  nausea, vomiting  pain, redness, or irritation at site where injected This list may not describe all possible side effects. Call your doctor for medical advice about side effects. You may report side effects to FDA at 1-800-FDA-1088. Where should I keep my medicine? This drug is given in a hospital or clinic and will not be stored at home. NOTE: This sheet is a summary. It may not cover all possible information. If you have questions about this medicine, talk to your doctor, pharmacist, or health care provider.  2021 Elsevier/Gold Standard (2020-02-20 14:00:47)  

## 2020-04-17 NOTE — Progress Notes (Signed)
04/17/2020 Stephen Gray [id number] [birth year]   HISTORY OF PRESENT ILLNESS: This is a 75 year old male who is new to our office.  Has been referred here by his PCP, Dr. [name], for evaluation regarding iron deficiency anemia and to discuss EGD and colonoscopy.  He just moved to [place] about a month or so ago.  He moved here from [place] to be closer to his children.  On recent blood work his hemoglobin is found to be 12.3 g with a normal MCV.  Ferritin was low at 20 and serum iron was low at 39.  He received an iron infusion last week and is getting another one next week.  He denies seeing any blood in his stools or black stools.  He they tell me that he had a colonoscopy in [place] in 2019, but he does not recall exact details of it or even recall who performed that for him.  He has no other significant GI complaints.  He just had an echo performed looked okay and a stress test as well that does not look like it showed any major abnormalities.  He is seeing cardiology for follow-up in a couple of weeks.   Past Medical History:  Diagnosis Date  . Anxiety   . Diabetes (HCC)   . GERD (gastroesophageal reflux disease)   . Hypercholesteremia   . Hypertension    Past Surgical History:  Procedure Laterality Date  . TESTICULAR EXPLORATION      reports that he has quit smoking. He has never used smokeless tobacco. He reports previous alcohol use. He reports previous drug use. family history includes Diabetes in his mother; Kidney disease in his father. Allergies  Allergen Reactions  . Shrimp Flavor Anaphylaxis, Hives, Shortness Of Breath, Itching, Nausea And Vomiting, Swelling and Rash      Outpatient Encounter Medications as of 04/17/2020  Medication Sig  . amLODipine (NORVASC) 5 MG tablet Take 1 tablet (5 mg total) by mouth every evening.  . Cholecalciferol (VITAMIN D-3) 25 MCG (1000 UT) CAPS Take by mouth.  . Dapagliflozin-metFORMIN HCl ER (XIGDUO XR) 12-998 MG TB24 Take  1 tablet by mouth daily.  . Multiple Vitamin (MULTIVITAMIN ADULT PO) Take by mouth.  . simvastatin (ZOCOR) 40 MG tablet Take 1 tablet (40 mg total) by mouth at bedtime.  . tamsulosin (FLOMAX) 0.4 MG CAPS capsule Take 1 capsule (0.4 mg total) by mouth daily after supper.  . valsartan-hydrochlorothiazide (DIOVAN-HCT) 320-25 MG tablet Take 1 tablet by mouth in the morning.   Facility-Administered Encounter Medications as of 04/17/2020  Medication  . 0.9 %  sodium chloride infusion    REVIEW OF SYSTEMS  : All other systems reviewed and negative except where noted in the History of Present Illness.   PHYSICAL EXAM: BP 108/62   Pulse 64   Ht 6\' 2"  (1.88 m)   Wt 192 lb (87.1 kg)   BMI 24.65 kg/m  General: Well developed AA male in no acute distress Head: Normocephalic and atraumatic Eyes:  Sclerae anicteric, conjunctiva pink. Ears: Normal auditory acuity  Lungs: Clear throughout to auscultation; no W/R/R. Heart: Regular rate and rhythm; no M/R/G. Abdomen: Soft, non-distended.  BS present.  Non-tender. Rectal:  Will be done at the time of colonoscopy. Musculoskeletal: Symmetrical with no gross deformities  Skin: No lesions on visible extremities Extremities: No edema  Neurological: Alert oriented x 4, grossly non-focal Psychological:  Alert and cooperative. Normal mood and affect  ASSESSMENT AND PLAN: *Mild iron  deficiency anemia: Hemoglobin 12.3 g with a normal MCV.  Ferritin was low at 20 and serum iron was slightly low at 39.  Had iron infusion last week and will get second infusion next week.  PCP sent him here to discuss EGD and colonoscopy.  He reports colonoscopy in 2019 in [place], but unsure of details regarding the results and he cannot recall who performed that for him.  He is now currently residing here in [place] with his children.  There is no overt sign of GI bleeding.  We will plan for EGD and colonoscopy with Dr. Rhea Belton at family request.  The risks, benefits, and  alternatives to EGD and colonoscopy were discussed with the patient and he consents to proceed.   CC:  Etta Grandchild, MD

## 2020-04-17 NOTE — Patient Instructions (Signed)
If you are age 75 or older, your body mass index should be between 23-30. Your Body mass index is 24.65 kg/m. If this is out of the aforementioned range listed, please consider follow up with your Primary Care Provider.  If you are age 40 or younger, your body mass index should be between 19-25. Your Body mass index is 24.65 kg/m. If this is out of the aformentioned range listed, please consider follow up with your Primary Care Provider.   You have been scheduled for an endoscopy and colonoscopy. Please follow the written instructions given to you at your visit today. Please pick up your prep supplies at the pharmacy within the next 1-3 days. If you use inhalers (even only as needed), please bring them with you on the day of your procedure.   Thank you for choosing me and [facility].  Doug Sou, PA-C

## 2020-04-17 NOTE — Progress Notes (Signed)
PATIENT CARE CENTER NOTE  Diagnosis: Other iron deficiency anemia (D50.8)   Provider: Sanda Linger, MD   Procedure: Holly Bodily infusion    Note: Patient received Injectafer infusion via PIV. Tolerated well with no adverse reaction. Observed patient for 30 minutes post-infusion. Vital signs stable. Discharge instructions given. Patient to come back next week for second infusion. Patient alert, oriented and ambulatory at discharge.

## 2020-04-17 NOTE — Telephone Encounter (Signed)
Called and spoke with April Swager,informed per Dr Beverlee Nims note, verbalized understanding and said to tell him Thank you

## 2020-04-18 NOTE — Progress Notes (Signed)
Addendum: Reviewed and agree with assessment and management plan. Tijana Walder M, MD  

## 2020-04-24 ENCOUNTER — Other Ambulatory Visit: Payer: Self-pay

## 2020-04-26 ENCOUNTER — Other Ambulatory Visit: Payer: Self-pay

## 2020-04-26 ENCOUNTER — Ambulatory Visit (HOSPITAL_COMMUNITY)
Admission: RE | Admit: 2020-04-26 | Discharge: 2020-04-26 | Disposition: A | Discharge status: Home / Self Care | Payer: Medicare (Managed Care) | Source: Ambulatory Visit | Attending: Internal Medicine | Admitting: Internal Medicine

## 2020-04-26 ENCOUNTER — Ambulatory Visit (INDEPENDENT_AMBULATORY_CARE_PROVIDER_SITE_OTHER): Payer: Medicare (Managed Care) | Admitting: Endocrinology

## 2020-04-26 ENCOUNTER — Encounter: Payer: Self-pay | Admitting: Endocrinology

## 2020-04-26 VITALS — BP 140/60 | HR 92 | Ht 75.0 in | Wt 189.0 lb

## 2020-04-26 DIAGNOSIS — N62 Hypertrophy of breast: Secondary | ICD-10-CM

## 2020-04-26 DIAGNOSIS — D509 Iron deficiency anemia, unspecified: Secondary | ICD-10-CM | POA: Diagnosis not present

## 2020-04-26 DIAGNOSIS — E119 Type 2 diabetes mellitus without complications: Secondary | ICD-10-CM | POA: Diagnosis not present

## 2020-04-26 LAB — FOLLICLE STIMULATING HORMONE: FSH: 66.4 m[IU]/mL — ABNORMAL HIGH (ref 1.4–18.1)

## 2020-04-26 LAB — LUTEINIZING HORMONE: LH: 38.35 m[IU]/mL — ABNORMAL HIGH (ref 3.10–34.60)

## 2020-04-26 LAB — HCG, QUANTITATIVE, PREGNANCY: Quantitative HCG: 3.24 m[IU]/mL

## 2020-04-26 MED ORDER — SODIUM CHLORIDE 0.9 % IV SOLN
INTRAVENOUS | Status: DC | PRN
  Administered 2020-04-26: 250 mL via INTRAVENOUS

## 2020-04-26 MED ORDER — SODIUM CHLORIDE 0.9 % IV SOLN
750.0000 mg | Freq: Once | INTRAVENOUS | Status: AC
  Administered 2020-04-26: 750 mg via INTRAVENOUS
  Filled 2020-04-26: qty 15

## 2020-04-26 NOTE — Patient Instructions (Addendum)
Blood tests are requested for you today.  We'll let you know about the results.   Let's check a mammogram.  you will receive a phone call, about a day and time for an appointment.

## 2020-04-26 NOTE — Discharge Instructions (Signed)
Ferric carboxymaltose injection What is this medicine? FERRIC CARBOXYMALTOSE (ferr-ik car-box-ee-mol-toes) is an iron complex. Iron is used to make healthy red blood cells, which carry oxygen and nutrients throughout the body. This medicine is used to treat anemia in people with chronic kidney disease or people who cannot take iron by mouth. This medicine may be used for other purposes; ask your health care provider or pharmacist if you have questions. COMMON BRAND NAME(S): Injectafer What should I tell my health care provider before I take this medicine? They need to know if you have any of these conditions:  high levels of iron in the blood  liver disease  an unusual or allergic reaction to iron, other medicines, foods, dyes, or preservatives  pregnant or trying to get pregnant  breast-feeding How should I use this medicine? This medicine is for infusion into a vein. It is given by a health care professional in a hospital or clinic setting. Talk to your pediatrician regarding the use of this medicine in children. Special care may be needed. Overdosage: If you think you have taken too much of this medicine contact a poison control center or emergency room at once. NOTE: This medicine is only for you. Do not share this medicine with others. What if I miss a dose? Keep appointments for follow-up doses. It is important not to miss your dose. Call your care team if you are unable to keep an appointment. What may interact with this medicine? Do not take this medicine with any of the following medications:  deferoxamine  dimercaprol  other iron products This list may not describe all possible interactions. Give your health care provider a list of all the medicines, herbs, non-prescription drugs, or dietary supplements you use. Also tell them if you smoke, drink alcohol, or use illegal drugs. Some items may interact with your medicine. What should I watch for while using this  medicine? Visit your doctor or health care professional regularly. Tell your doctor if your symptoms do not start to get better or if they get worse. You may need blood work done while you are taking this medicine. You may need to follow a special diet. Talk to your doctor. Foods that contain iron include: whole grains/cereals, dried fruits, beans, or peas, leafy green vegetables, and organ meats (liver, kidney). What side effects may I notice from receiving this medicine? Side effects that you should report to your doctor or health care professional as soon as possible:  allergic reactions like skin rash, itching or hives, swelling of the face, lips, or tongue  dizziness  facial flushing Side effects that usually do not require medical attention (report to your doctor or health care professional if they continue or are bothersome):  changes in taste  constipation  headache  nausea, vomiting  pain, redness, or irritation at site where injected This list may not describe all possible side effects. Call your doctor for medical advice about side effects. You may report side effects to FDA at 1-800-FDA-1088. Where should I keep my medicine? This drug is given in a hospital or clinic and will not be stored at home. NOTE: This sheet is a summary. It may not cover all possible information. If you have questions about this medicine, talk to your doctor, pharmacist, or health care provider.  2021 Elsevier/Gold Standard (2020-02-20 14:00:47)  

## 2020-04-26 NOTE — Progress Notes (Signed)
Patient received IV injectafer as ordered by Sanda Linger, MD. Observed for at least 30 minutes post infusion.Tolerated well, vitals stable, discharge instructions given, verbalized understanding. Patient alert, oriented and ambulatory at the time of discharge.

## 2020-04-26 NOTE — Progress Notes (Signed)
Subjective:    Patient ID: Stephen Factor., male    DOB: Dec 26, 1945, 75 y.o.   MRN: [id number]  HPI Pt is referred by Dr [name], for gynecomastia.  Pt had puberty at the normal age.  He has 2 biological children.  He has never been diagnosed with hypogonadism.  He denies any h/o infertility.  He has never had liver disease, kidney disease, cancer, cystic fibrosis, ulcerative colitis, alcoholism, or BPH. Her has never taken cimetidine, growth hormone, risperidone, hCG, opioids, androgens, 5-alpha-reductase inhibitors, cancer chemotherapy, estrogens, or ketoconazole.  He now reports 10 years of slight swelling at both breasts, and slight assoc pain.  he also has intermitt galactorrhea.  He had surgery for left undescended testicle at age 89.  He will see Urol soon, for BPH.  Past Medical History:  Diagnosis Date  . Anxiety   . Diabetes (HCC)   . GERD (gastroesophageal reflux disease)   . Hypercholesteremia   . Hypertension     Past Surgical History:  Procedure Laterality Date  . TESTICULAR EXPLORATION      Social History   Socioeconomic History  . Marital status: Widowed    Spouse name: Not on file  . Number of children: Not on file  . Years of education: Not on file  . Highest education level: Not on file  Occupational History  . Not on file  Tobacco Use  . Smoking status: Former Games developer  . Smokeless tobacco: Never Used  Substance and Sexual Activity  . Alcohol use: Not Currently  . Drug use: Not Currently  . Sexual activity: Not Currently    Partners: Female  Other Topics Concern  . Not on file  Social History Narrative  . Not on file   Social Determinants of Health   Financial Resource Strain: Not on file  Food Insecurity: Not on file  Transportation Needs: Not on file  Physical Activity: Not on file  Stress: Not on file  Social Connections: Not on file  Intimate Partner Violence: Not on file    Current Outpatient Medications on File Prior to Visit   Medication Sig Dispense Refill  . amLODipine (NORVASC) 5 MG tablet Take 1 tablet (5 mg total) by mouth every evening. 90 tablet 0  . Cholecalciferol (VITAMIN D-3) 25 MCG (1000 UT) CAPS Take by mouth.    . Dapagliflozin-metFORMIN HCl ER (XIGDUO XR) 12-998 MG TB24 Take 1 tablet by mouth daily. 90 tablet 1  . Multiple Vitamin (MULTIVITAMIN ADULT PO) Take by mouth.    . simvastatin (ZOCOR) 40 MG tablet Take 1 tablet (40 mg total) by mouth at bedtime. 90 tablet 1  . tamsulosin (FLOMAX) 0.4 MG CAPS capsule Take 1 capsule (0.4 mg total) by mouth daily after supper. 90 capsule 1  . valsartan-hydrochlorothiazide (DIOVAN-HCT) 320-25 MG tablet Take 1 tablet by mouth in the morning. 90 tablet 1   No current facility-administered medications on file prior to visit.    Allergies  Allergen Reactions  . Shrimp Flavor Anaphylaxis, Hives, Shortness Of Breath, Itching, Nausea And Vomiting, Swelling and Rash    Family History  Problem Relation Age of Onset  . Diabetes Mother   . Kidney disease Father   . Other Neg Hx        gynecomastia    BP 140/60 (BP Location: Right Arm, Patient Position: Sitting, Cuff Size: Normal)   Pulse 92   Ht 6\' 3"  (1.905 m)   Wt 189 lb (85.7 kg)   SpO2 95%  BMI 23.62 kg/m     Review of Systems denies weight change, muscle weakness, headache, and sob.      Objective:   Physical Exam VS: see vs page GEN: no distress HEAD: head: no deformity eyes: no periorbital swelling, no proptosis external nose and ears are normal.   NECK: supple, thyroid is not enlarged.   BREASTS: bilat gynecomastia and pseudogynecomastia are noted.   GENITALIA:  Normal male, except testicles are very small and soft.   MUSCULOSKELETAL: muscle bulk and strength are grossly normal.  no joint swelling is seen  gait is normal and steady.   EXTEMITIES: no leg edema NEURO: sensation is intact to touch on all 4's.   SKIN:  Normal texture and temperature.  No rash or suspicious lesion is  visible.  Normal male hair distribution. NODES:  None palpable at the neck.   PSYCH:  Does not appear anxious nor depressed.    Lab Results  Component Value Date   TSH 1.43 03/19/2020   Lab Results  Component Value Date   PSA 2.8 03/19/2020   I have reviewed outside records, and summarized: Pt was noted to have gynecomastia, and referred here.  He was noted to have elev PSA and BPH      Assessment & Plan:  Gynecomastia, new to me, uncertain etiology and prognosis.  BPH: any rx of the above will have to take this into account  Patient Instructions  Blood tests are requested for you today.  We'll let you know about the results.   Let's check a mammogram.  you will receive a phone call, about a day and time for an appointment.

## 2020-04-27 LAB — TESTOSTERONE,FREE AND TOTAL
Testosterone, Free: 2.4 pg/mL — ABNORMAL LOW (ref 6.6–18.1)
Testosterone: 76 ng/dL — ABNORMAL LOW (ref 264–916)

## 2020-05-01 ENCOUNTER — Other Ambulatory Visit: Payer: Self-pay

## 2020-05-01 ENCOUNTER — Inpatient Hospital Stay: Payer: Medicare (Managed Care)

## 2020-05-01 ENCOUNTER — Encounter: Payer: Self-pay | Admitting: Cardiology

## 2020-05-01 ENCOUNTER — Ambulatory Visit: Payer: Medicare (Managed Care) | Admitting: Cardiology

## 2020-05-01 VITALS — BP 118/63 | HR 68 | Temp 97.6°F | Resp 16 | Ht 75.0 in | Wt 188.6 lb

## 2020-05-01 DIAGNOSIS — I471 Supraventricular tachycardia: Secondary | ICD-10-CM

## 2020-05-01 DIAGNOSIS — I10 Essential (primary) hypertension: Secondary | ICD-10-CM

## 2020-05-01 DIAGNOSIS — Z712 Person consulting for explanation of examination or test findings: Secondary | ICD-10-CM

## 2020-05-01 DIAGNOSIS — R001 Bradycardia, unspecified: Secondary | ICD-10-CM

## 2020-05-01 DIAGNOSIS — E119 Type 2 diabetes mellitus without complications: Secondary | ICD-10-CM

## 2020-05-01 DIAGNOSIS — I3139 Other pericardial effusion (noninflammatory): Secondary | ICD-10-CM

## 2020-05-01 DIAGNOSIS — I313 Pericardial effusion (noninflammatory): Secondary | ICD-10-CM

## 2020-05-01 NOTE — Progress Notes (Signed)
ID:  Stephen Factor., DOB 26-May-1945, MRN [id number]  PCP:  Etta Grandchild, MD  Cardiologist:  Tessa Lerner, DO, [facility] (established care 04/01/2020)  Chief Complaint  Patient presents with  . Follow-up  . Results   Date: 05/01/20 Last Office Visit: 04/01/2020  HPI  Stephen Spoelstra. is a 75 y.o. male who presents to the office with a chief complaint of " 1 month follow-up for evaluation of bradycardia and review test results." Patient's past medical history and cardiovascular risk factors include: Hypertension, Hyperlipidemia, NIDDM, Vitamin B12 deficiency, former smoker, advanced age.  He is referred to the office at the request of Etta Grandchild, MD for evaluation of bradycardia.  Patient is accompanied by his daughter Stephen Gray at today's office visit.  Patient provides verbal consent in regards to having her present during today's encounter.  In December 2021 patient moved from [place] to [place] to be closer with kids and recently establish care with PCP given his chronic comorbid conditions.  During that encounter an EKG was performed and interpreted as abnormal given the sinus bradycardia and nonspecific T wave abnormalities.  He is referred to cardiology for further evaluation and management.  Patient appeared to have asymptomatic bradycardia most likely transient.  However given his age and chronic comorbid conditions recommended a GXT to evaluate for chronotropic competence as well as exercise-induced ischemia.  Patient since then did undergo exercise treadmill stress test and was noted to have good chronotropic competence as he achieved greater than 85% of age-predicted maximum heart rate.  GXT also noted and negative stress ECG but did have spurts of supraventricular tachycardia most likely atrial tach.  An echo noted preserved LVEF without any significant valvular heart disease but he does have a moderate pericardial effusion that is not hemodynamically significant.   This was conveyed to the patient and his daughter at today's office visit.  Since last office visit he denies any chest pain or anginal equivalent.  FUNCTIONAL STATUS: Active for his age with ADLs and doing daily chores.  However, no structured exercise program or daily routine.  ALLERGIES: Allergies  Allergen Reactions  . Shrimp Flavor Anaphylaxis, Hives, Shortness Of Breath, Itching, Nausea And Vomiting, Swelling and Rash    MEDICATION LIST PRIOR TO VISIT: Current Meds  Medication Sig  . amLODipine (NORVASC) 5 MG tablet Take 1 tablet (5 mg total) by mouth every evening.  . Cholecalciferol (VITAMIN D-3) 25 MCG (1000 UT) CAPS Take by mouth.  . Dapagliflozin-metFORMIN HCl ER (XIGDUO XR) 12-998 MG TB24 Take 1 tablet by mouth daily.  . Multiple Vitamin (MULTIVITAMIN ADULT PO) Take by mouth.  . simvastatin (ZOCOR) 40 MG tablet Take 1 tablet (40 mg total) by mouth at bedtime.  . tamsulosin (FLOMAX) 0.4 MG CAPS capsule Take 1 capsule (0.4 mg total) by mouth daily after supper.  . valsartan-hydrochlorothiazide (DIOVAN-HCT) 320-25 MG tablet Take 1 tablet by mouth in the morning.     PAST MEDICAL HISTORY: Past Medical History:  Diagnosis Date  . Anxiety   . Diabetes (HCC)   . GERD (gastroesophageal reflux disease)   . Hypercholesteremia   . Hypertension     PAST SURGICAL HISTORY: Past Surgical History:  Procedure Laterality Date  . TESTICULAR EXPLORATION      FAMILY HISTORY: The patient family history includes Diabetes in his mother; Kidney disease in his father.  SOCIAL HISTORY:  The patient  reports that he has quit smoking. He has never used smokeless tobacco. He reports  previous alcohol use. He reports previous drug use.  REVIEW OF SYSTEMS: Review of Systems  Constitutional: Negative for chills and fever.  HENT: Positive for hearing loss (decreased hearing, per patient. ). Negative for hoarse voice and nosebleeds.   Eyes: Positive for blurred vision (cataract right eye).  Negative for discharge, double vision and pain.  Cardiovascular: Negative for chest pain, claudication, dyspnea on exertion, leg swelling, near-syncope, orthopnea, palpitations, paroxysmal nocturnal dyspnea and syncope.  Respiratory: Negative for hemoptysis and shortness of breath.   Musculoskeletal: Negative for muscle cramps and myalgias.  Gastrointestinal: Negative for abdominal pain, constipation, diarrhea, hematemesis, hematochezia, melena, nausea and vomiting.  Neurological: Negative for dizziness and light-headedness.    PHYSICAL EXAM: Vitals with BMI 05/01/2020 04/26/2020 04/26/2020  Height 6\' 3"  6\' 3"  -  Weight 188 lbs 10 oz 189 lbs -  BMI 23.57 23.62 -  Systolic 118 140  Diastolic 63 60 72  Pulse 68 92 70   Orthostatic VS for the past 72 hrs (Last 3 readings):  Orthostatic BP Patient Position BP Location Cuff Size Orthostatic Pulse  05/01/20 1200 109/64 Standing Left Arm Normal 78  05/01/20 1159 98/64 Sitting Left Arm Normal 87  05/01/20 1158 111/58 Supine Left Arm Normal 55    CONSTITUTIONAL: Well-developed and well-nourished. No acute distress.  SKIN: Skin is warm and dry. No rash noted. No cyanosis. No pallor. No jaundice HEAD: Normocephalic and atraumatic.  EYES: No scleral icterus MOUTH/THROAT: Moist oral membranes.   NECK: No JVD present. No thyromegaly noted. No carotid bruits  LYMPHATIC: No visible cervical adenopathy.  CHEST Normal respiratory effort. No intercostal retractions  LUNGS: Clear to auscultation bilaterally. No stridor. No wheezes. No rales.  CARDIOVASCULAR: Regular rate and rhythm, positive S1-S2, no murmurs rubs or gallops appreciated. ABDOMINAL: No apparent ascites.  EXTREMITIES: No peripheral edema  HEMATOLOGIC: No significant bruising NEUROLOGIC: Oriented to person, place, and time. Nonfocal. Normal muscle tone.  PSYCHIATRIC: Normal mood and affect. Normal behavior. Cooperative  CARDIAC DATABASE: EKG: 05/01/20: Sinus  Bradycardia, 56bpm,  normal axis, nonspecific T-abnormality.   Echocardiogram: 04/11/2020: Normal LV systolic function with visual EF 60-65%. Left ventricle cavity is normal in size. Mild left ventricular hypertrophy. Normal global wall motion. Normal diastolic filling pattern, normal LAP.  Mild tricuspid regurgitation. Moderate pericardial effusion. There is no hemodynamic significance. No prior study for comparison.  Stress Testing: Treadmill Exercise Stress 04/12/2020: The patient exercised for 5 minutes and 59 seconds on Bruce protocol; achieved 4.76 METs at 119% of maximum predicted heart rate. Negative for ischemia. Chest pain is not present.  The heart rate response was accelerated. The baseline blood pressure was 144/84 mmHg and increased to 234/88 mmHg at peak exercise, which is a hypertensive response to exercise.  Resting ECG demonstrated normal sinus rhythm. Observed left ventricular hypertrophy with secondary ST-T changes, repolarization abnormality.  Peak ECG demonstrated no significant ST-T change from baseline abnormality. During exercise the peak ECG revealed nonsustained supraventricular (Atrial) tachycardia and rare premature ventricular contractions. Nonsustained supraventricular tachycardia and occasional premature ventricular contractions noted on ECG at stress.  The heart rate response was accelerated. The blood pressure response was hypertensive.  Heart Catheterization: None  LABORATORY DATA: CBC Latest Ref Rng & Units 03/19/2020  WBC 4.0 - 10.5 K/uL 8.0  Hemoglobin 13.0 - 17.0 g/dL 12.3(L)  Hematocrit 39.0 - 52.0 % 36.1(L)  Platelets 150.0 - 400.0 K/uL 258.0    CMP Latest Ref Rng & Units 03/19/2020  Glucose 70 - 99 mg/dL 03/21/2020)  BUN 6 -  23 mg/dL 11  Creatinine 8.75 - 6.43 mg/dL 3.29  Sodium 518 - 841 mEq/L 138  Potassium 3.5 - 5.1 mEq/L 3.5  Chloride 96 - 112 mEq/L 100  CO2 19 - 32 mEq/L 32  Calcium 8.4 - 10.5 mg/dL 9.3  Total Protein 6.0 - 8.3 g/dL 7.6  Total Bilirubin 0.2  - 1.2 mg/dL 0.6  Alkaline Phos 39 - 117 U/L 76  AST 0 - 37 U/L 9  ALT 0 - 53 U/L 7    Lipid Panel     Component Value Date/Time   CHOL 152 03/19/2020 1525   TRIG 101.0 03/19/2020 1525   HDL 62.50 03/19/2020 1525   CHOLHDL 2 03/19/2020 1525   VLDL 20.2 03/19/2020 1525   LDLCALC 69 03/19/2020 1525    No components found for: NTPROBNP No results for input(s): PROBNP in the last 8760 hours. Recent Labs    03/19/20 1525  TSH 1.43    BMP Recent Labs    03/19/20 1525  NA 138  K 3.5  CL 100  CO2 32  GLUCOSE 166*  BUN 11  CREATININE 0.79  CALCIUM 9.3    HEMOGLOBIN A1C Lab Results  Component Value Date   HGBA1C 8.0 (H) 03/19/2020    IMPRESSION:    ICD-10-CM   1. Bradycardia  R00.1 EKG 12-Lead    LONG TERM MONITOR (3-14 DAYS)  2. Atrial tachycardia (HCC)  I47.1 LONG TERM MONITOR (3-14 DAYS)  3. Type 2 diabetes mellitus without complication, without long-term current use of insulin (HCC)  E11.9   4. Benign hypertension  I10   5. Pericardial effusion  I31.3 ECHOCARDIOGRAM COMPLETE  6. Encounter to discuss test results  Z71.2      RECOMMENDATIONS: Stephen Gray. is a 75 y.o. male whose past medical history and cardiac risk factors include: Hypertension, Hyperlipidemia, NIDDM, Vitamin B12 deficiency, former smoker, advanced age.  Bradycardia:  Based on the current work-up his bradycardia is very transient and overall asymptomatic.  EF is preserved on the most recent echocardiogram.  GXT noted appropriate chronotropic competence.  Patient was also noted to have short burst of supraventricular tachycardia suggestive of atrial tach.    Patient will be scheduled for an extended Holter monitor to further evaluate the degree of bradycardia and dysrhythmias.    TSH within normal limits.  And hemoglobin relatively stable at 12.3 g/dL.  Continue to monitor.  Pericardial effusion:  Most recent echocardiogram noted moderate pericardial effusion without  hemodynamic significance.  No known reversible causes identified.  Repeat echocardiogram in 6 months to reevaluate the degree of pericardial effusion and its hemodynamic significance.  Benign essential hypertension:  Patient and daughter are requesting my assistance in the management of high blood pressure.  Patient is a systolic blood pressure is greater than 130 mmHg despite being a diabetic.  He is recommended to take Diovan in the morning and amlodipine at night.  Patient did have a hypertensive response exercise on his most recent GXT.  However, since he is normotensive we will hold off up titration of antihypertensive medications during today's encounter.  He is encouraged to keep a log of his blood pressures and to bring it in at the next office visit.  Low-salt diet recommended.  Non-insulin-dependent diabetes mellitus type 2:  Currently managed by primary care provider.  Mixed Hyperlipidemia:  . Continue statin therapy.   . Follow lipids. . Currently managed by primary care provider. . Patient denies myalgia or other side effects.   FINAL MEDICATION  LIST END OF ENCOUNTER:  Current Outpatient Medications:  .  amLODipine (NORVASC) 5 MG tablet, Take 1 tablet (5 mg total) by mouth every evening., Disp: 90 tablet, Rfl: 0 .  Cholecalciferol (VITAMIN D-3) 25 MCG (1000 UT) CAPS, Take by mouth., Disp: , Rfl:  .  Dapagliflozin-metFORMIN HCl ER (XIGDUO XR) 12-998 MG TB24, Take 1 tablet by mouth daily., Disp: 90 tablet, Rfl: 1 .  Multiple Vitamin (MULTIVITAMIN ADULT PO), Take by mouth., Disp: , Rfl:  .  simvastatin (ZOCOR) 40 MG tablet, Take 1 tablet (40 mg total) by mouth at bedtime., Disp: 90 tablet, Rfl: 1 .  tamsulosin (FLOMAX) 0.4 MG CAPS capsule, Take 1 capsule (0.4 mg total) by mouth daily after supper., Disp: 90 capsule, Rfl: 1 .  valsartan-hydrochlorothiazide (DIOVAN-HCT) 320-25 MG tablet, Take 1 tablet by mouth in the morning., Disp: 90 tablet, Rfl: 1  Orders Placed  This Encounter  Procedures  . LONG TERM MONITOR (3-14 DAYS)  . EKG 12-Lead  . ECHOCARDIOGRAM COMPLETE   There are no Patient Instructions on file for this visit.   --Continue cardiac medications as reconciled in final medication list. --Return in about 6 months (around 11/04/2020) for Follow up bradycardia, review monitor results and echo . Or sooner if needed. --Continue follow-up with your primary care physician regarding the management of your other chronic comorbid conditions.  Patient's questions and concerns were addressed to his satisfaction. He voices understanding of the instructions provided during this encounter.   This note was created using a voice recognition software as a result there may be grammatical errors inadvertently enclosed that do not reflect the nature of this encounter. Every attempt is made to correct such errors.  Tessa Lerner, [place], [facility]  Pager: [phone] Office: [phone]

## 2020-05-02 ENCOUNTER — Other Ambulatory Visit: Payer: Self-pay | Admitting: Endocrinology

## 2020-05-02 DIAGNOSIS — N62 Hypertrophy of breast: Secondary | ICD-10-CM

## 2020-05-05 LAB — ESTRADIOL, FREE
Estradiol, Free: 0.34 pg/mL
Estradiol: 17 pg/mL (ref <=29)

## 2020-05-05 LAB — PROLACTIN: Prolactin: 7.3 ng/mL (ref 2.0–18.0)

## 2020-05-15 ENCOUNTER — Telehealth: Payer: Self-pay | Admitting: Gastroenterology

## 2020-05-16 NOTE — Telephone Encounter (Signed)
Called patients daughter and went over what to pick up for Miralax prep and went over instruction with her. She stated she had a better understanding of prep instructions.

## 2020-05-16 NOTE — Telephone Encounter (Signed)
I think you worked with Shanda Bumps on this day

## 2020-05-17 ENCOUNTER — Ambulatory Visit: Payer: Medicare (Managed Care) | Admitting: Endocrinology

## 2020-05-20 ENCOUNTER — Telehealth: Payer: Self-pay

## 2020-05-20 NOTE — Telephone Encounter (Signed)
Spoke with patient's daughter regarding cancelling Stephen Gray's procedure. She stated that she had just spoken with [facility] and was aware that his procedure had been canceled. She stated she would call back to schedule once his [name] medicare was approved.

## 2020-05-21 ENCOUNTER — Encounter: Payer: Medicare (Managed Care) | Admitting: Internal Medicine

## 2020-05-24 ENCOUNTER — Ambulatory Visit
Admission: RE | Admit: 2020-05-24 | Discharge: 2020-05-24 | Disposition: A | Discharge status: Home / Self Care | Payer: Medicare (Managed Care) | Source: Ambulatory Visit | Attending: Endocrinology | Admitting: Endocrinology

## 2020-05-24 ENCOUNTER — Ambulatory Visit: Payer: Medicare (Managed Care)

## 2020-05-24 ENCOUNTER — Other Ambulatory Visit: Payer: Self-pay

## 2020-05-24 DIAGNOSIS — N62 Hypertrophy of breast: Secondary | ICD-10-CM

## 2020-06-03 ENCOUNTER — Telehealth: Payer: Self-pay

## 2020-06-05 NOTE — Telephone Encounter (Signed)
done

## 2020-07-01 LAB — HM DIABETES EYE EXAM

## 2020-07-02 ENCOUNTER — Ambulatory Visit: Payer: Medicare (Managed Care) | Admitting: Podiatry

## 2020-07-11 DIAGNOSIS — H52211 Irregular astigmatism, right eye: Secondary | ICD-10-CM | POA: Diagnosis not present

## 2020-07-11 DIAGNOSIS — H269 Unspecified cataract: Secondary | ICD-10-CM | POA: Diagnosis not present

## 2020-07-11 DIAGNOSIS — H268 Other specified cataract: Secondary | ICD-10-CM | POA: Diagnosis not present

## 2020-07-18 DIAGNOSIS — H25811 Combined forms of age-related cataract, right eye: Secondary | ICD-10-CM | POA: Diagnosis not present

## 2020-07-18 DIAGNOSIS — H2511 Age-related nuclear cataract, right eye: Secondary | ICD-10-CM | POA: Diagnosis not present

## 2020-07-18 DIAGNOSIS — Z9841 Cataract extraction status, right eye: Secondary | ICD-10-CM | POA: Insufficient documentation

## 2020-07-18 DIAGNOSIS — H268 Other specified cataract: Secondary | ICD-10-CM | POA: Diagnosis not present

## 2020-07-18 DIAGNOSIS — E119 Type 2 diabetes mellitus without complications: Secondary | ICD-10-CM | POA: Diagnosis not present

## 2020-07-19 DIAGNOSIS — Z961 Presence of intraocular lens: Secondary | ICD-10-CM | POA: Diagnosis not present

## 2020-07-19 DIAGNOSIS — H53012 Deprivation amblyopia, left eye: Secondary | ICD-10-CM | POA: Diagnosis not present

## 2020-07-19 DIAGNOSIS — Z48810 Encounter for surgical aftercare following surgery on the sense organs: Secondary | ICD-10-CM | POA: Diagnosis not present

## 2020-07-23 DIAGNOSIS — R3915 Urgency of urination: Secondary | ICD-10-CM | POA: Diagnosis not present

## 2020-07-30 ENCOUNTER — Emergency Department (HOSPITAL_COMMUNITY): Payer: Medicare Other

## 2020-07-30 ENCOUNTER — Other Ambulatory Visit: Payer: Self-pay

## 2020-07-30 ENCOUNTER — Encounter (HOSPITAL_COMMUNITY): Payer: Self-pay | Admitting: Emergency Medicine

## 2020-07-30 ENCOUNTER — Observation Stay (HOSPITAL_COMMUNITY)
Admission: EM | Admit: 2020-07-30 | Discharge: 2020-07-31 | Disposition: A | Discharge status: Home / Self Care | Payer: Medicare Other | Attending: Surgery | Admitting: Surgery

## 2020-07-30 DIAGNOSIS — W1830XA Fall on same level, unspecified, initial encounter: Secondary | ICD-10-CM | POA: Insufficient documentation

## 2020-07-30 DIAGNOSIS — S299XXA Unspecified injury of thorax, initial encounter: Secondary | ICD-10-CM | POA: Diagnosis present

## 2020-07-30 DIAGNOSIS — R0600 Dyspnea, unspecified: Secondary | ICD-10-CM | POA: Diagnosis not present

## 2020-07-30 DIAGNOSIS — S270XXA Traumatic pneumothorax, initial encounter: Secondary | ICD-10-CM

## 2020-07-30 DIAGNOSIS — S2242XA Multiple fractures of ribs, left side, initial encounter for closed fracture: Secondary | ICD-10-CM | POA: Diagnosis not present

## 2020-07-30 DIAGNOSIS — J9811 Atelectasis: Secondary | ICD-10-CM | POA: Diagnosis not present

## 2020-07-30 DIAGNOSIS — I10 Essential (primary) hypertension: Secondary | ICD-10-CM | POA: Diagnosis not present

## 2020-07-30 DIAGNOSIS — R2681 Unsteadiness on feet: Secondary | ICD-10-CM | POA: Insufficient documentation

## 2020-07-30 DIAGNOSIS — R0689 Other abnormalities of breathing: Secondary | ICD-10-CM | POA: Diagnosis not present

## 2020-07-30 DIAGNOSIS — I517 Cardiomegaly: Secondary | ICD-10-CM | POA: Diagnosis not present

## 2020-07-30 DIAGNOSIS — W19XXXA Unspecified fall, initial encounter: Secondary | ICD-10-CM

## 2020-07-30 DIAGNOSIS — S0990XA Unspecified injury of head, initial encounter: Secondary | ICD-10-CM | POA: Diagnosis not present

## 2020-07-30 DIAGNOSIS — S2249XA Multiple fractures of ribs, unspecified side, initial encounter for closed fracture: Secondary | ICD-10-CM | POA: Diagnosis present

## 2020-07-30 DIAGNOSIS — G319 Degenerative disease of nervous system, unspecified: Secondary | ICD-10-CM | POA: Diagnosis not present

## 2020-07-30 DIAGNOSIS — E876 Hypokalemia: Secondary | ICD-10-CM | POA: Diagnosis not present

## 2020-07-30 DIAGNOSIS — U071 COVID-19: Secondary | ICD-10-CM | POA: Diagnosis not present

## 2020-07-30 DIAGNOSIS — Z87891 Personal history of nicotine dependence: Secondary | ICD-10-CM | POA: Insufficient documentation

## 2020-07-30 DIAGNOSIS — Z79899 Other long term (current) drug therapy: Secondary | ICD-10-CM | POA: Insufficient documentation

## 2020-07-30 DIAGNOSIS — I6782 Cerebral ischemia: Secondary | ICD-10-CM | POA: Diagnosis not present

## 2020-07-30 DIAGNOSIS — Z9181 History of falling: Secondary | ICD-10-CM | POA: Diagnosis not present

## 2020-07-30 DIAGNOSIS — I313 Pericardial effusion (noninflammatory): Secondary | ICD-10-CM

## 2020-07-30 DIAGNOSIS — E119 Type 2 diabetes mellitus without complications: Secondary | ICD-10-CM | POA: Diagnosis not present

## 2020-07-30 DIAGNOSIS — J939 Pneumothorax, unspecified: Secondary | ICD-10-CM

## 2020-07-30 DIAGNOSIS — J3489 Other specified disorders of nose and nasal sinuses: Secondary | ICD-10-CM | POA: Diagnosis not present

## 2020-07-30 DIAGNOSIS — I3139 Other pericardial effusion (noninflammatory): Secondary | ICD-10-CM

## 2020-07-30 LAB — URINALYSIS, ROUTINE W REFLEX MICROSCOPIC
Bacteria, UA: NONE SEEN
Bilirubin Urine: NEGATIVE
Glucose, UA: >=500 mg/dL — AB
Hgb urine dipstick: NEGATIVE
Ketones, ur: NEGATIVE mg/dL
Leukocytes,Ua: NEGATIVE
Nitrite: NEGATIVE
Protein, ur: NEGATIVE mg/dL
Specific Gravity, Urine: >1.046 — ABNORMAL HIGH (ref 1.005–1.030)
pH: 5 (ref 5.0–8.0)

## 2020-07-30 LAB — CBG MONITORING, ED: Glucose-Capillary: 220 mg/dL — ABNORMAL HIGH (ref 70–99)

## 2020-07-30 LAB — COMPREHENSIVE METABOLIC PANEL
ALT: 16 U/L (ref 0–44)
AST: 22 U/L (ref 15–41)
Albumin: 3.7 g/dL (ref 3.5–5.0)
Alkaline Phosphatase: 77 U/L (ref 38–126)
Anion gap: 14 (ref 5–15)
BUN: 20 mg/dL (ref 8–23)
CO2: 27 mmol/L (ref 22–32)
Calcium: 9 mg/dL (ref 8.9–10.3)
Chloride: 97 mmol/L — ABNORMAL LOW (ref 98–111)
Creatinine, Ser: 0.93 mg/dL (ref 0.61–1.24)
GFR, Estimated: >60 mL/min (ref >60)
Glucose, Bld: 423 mg/dL — ABNORMAL HIGH (ref 70–99)
Potassium: 3.2 mmol/L — ABNORMAL LOW (ref 3.5–5.1)
Sodium: 138 mmol/L (ref 135–145)
Total Bilirubin: 0.5 mg/dL (ref 0.3–1.2)
Total Protein: 7.1 g/dL (ref 6.5–8.1)

## 2020-07-30 LAB — CBC WITH DIFFERENTIAL/PLATELET
Abs Immature Granulocytes: 0.04 10*3/uL (ref 0.00–0.07)
Basophils Absolute: 0 10*3/uL (ref 0.0–0.1)
Basophils Relative: 0 %
Eosinophils Absolute: 0 10*3/uL (ref 0.0–0.5)
Eosinophils Relative: 0 %
HCT: 39 % (ref 39.0–52.0)
Hemoglobin: 13.1 g/dL (ref 13.0–17.0)
Immature Granulocytes: 0 %
Lymphocytes Relative: 7 %
Lymphs Abs: 0.8 10*3/uL (ref 0.7–4.0)
MCH: 31.8 pg (ref 26.0–34.0)
MCHC: 33.6 g/dL (ref 30.0–36.0)
MCV: 94.7 fL (ref 80.0–100.0)
Monocytes Absolute: 0.5 10*3/uL (ref 0.1–1.0)
Monocytes Relative: 5 %
Neutro Abs: 9.4 10*3/uL — ABNORMAL HIGH (ref 1.7–7.7)
Neutrophils Relative %: 88 %
Platelets: 245 10*3/uL (ref 150–400)
RBC: 4.12 MIL/uL — ABNORMAL LOW (ref 4.22–5.81)
RDW: 12.7 % (ref 11.5–15.5)
WBC: 10.7 10*3/uL — ABNORMAL HIGH (ref 4.0–10.5)
nRBC: 0 % (ref 0.0–0.2)

## 2020-07-30 LAB — RAPID URINE DRUG SCREEN, HOSP PERFORMED
Amphetamines: NOT DETECTED
Barbiturates: NOT DETECTED
Benzodiazepines: NOT DETECTED
Cocaine: NOT DETECTED
Opiates: NOT DETECTED
Tetrahydrocannabinol: NOT DETECTED

## 2020-07-30 LAB — RESP PANEL BY RT-PCR (FLU A&B, COVID) ARPGX2
Influenza A by PCR: NEGATIVE
Influenza B by PCR: NEGATIVE
SARS Coronavirus 2 by RT PCR: POSITIVE — AB

## 2020-07-30 LAB — GLUCOSE, CAPILLARY: Glucose-Capillary: 215 mg/dL — ABNORMAL HIGH (ref 70–99)

## 2020-07-30 MED ORDER — LIDOCAINE 5 % EX PTCH
2.0000 | MEDICATED_PATCH | CUTANEOUS | Status: DC
  Administered 2020-07-30: 2 via TRANSDERMAL
  Filled 2020-07-30: qty 2

## 2020-07-30 MED ORDER — FENTANYL CITRATE (PF) 100 MCG/2ML IJ SOLN
50.0000 ug | Freq: Once | INTRAMUSCULAR | Status: AC
Start: 2020-07-30 — End: 2020-07-30
  Administered 2020-07-30: 50 ug via INTRAVENOUS
  Filled 2020-07-30: qty 2

## 2020-07-30 MED ORDER — HYDROCHLOROTHIAZIDE 25 MG PO TABS
25.0000 mg | ORAL_TABLET | Freq: Every day | ORAL | Status: DC
  Administered 2020-07-31: 25 mg via ORAL
  Filled 2020-07-30: qty 1

## 2020-07-30 MED ORDER — OXYCODONE HCL 5 MG PO TABS
5.0000 mg | ORAL_TABLET | ORAL | Status: DC | PRN
  Administered 2020-07-30: 5 mg via ORAL
  Filled 2020-07-30 (×2): qty 1

## 2020-07-30 MED ORDER — DOCUSATE SODIUM 100 MG PO CAPS
100.0000 mg | ORAL_CAPSULE | Freq: Two times a day (BID) | ORAL | Status: DC
  Administered 2020-07-30 – 2020-07-31 (×2): 100 mg via ORAL
  Filled 2020-07-30 (×2): qty 1

## 2020-07-30 MED ORDER — SODIUM CHLORIDE 0.9 % IV SOLN: INTRAVENOUS | Status: DC

## 2020-07-30 MED ORDER — IRBESARTAN 300 MG PO TABS
300.0000 mg | ORAL_TABLET | Freq: Every day | ORAL | Status: DC
  Administered 2020-07-31: 300 mg via ORAL
  Filled 2020-07-30: qty 1

## 2020-07-30 MED ORDER — POTASSIUM CHLORIDE 20 MEQ PO PACK
40.0000 meq | PACK | Freq: Once | ORAL | Status: AC
  Administered 2020-07-30: 40 meq via ORAL
  Filled 2020-07-30: qty 2

## 2020-07-30 MED ORDER — ACETAMINOPHEN 325 MG PO TABS
650.0000 mg | ORAL_TABLET | Freq: Once | ORAL | Status: AC
  Administered 2020-07-30: 650 mg via ORAL
  Filled 2020-07-30: qty 2

## 2020-07-30 MED ORDER — AMLODIPINE BESYLATE 5 MG PO TABS: 5.0000 mg | ORAL_TABLET | Freq: Every evening | ORAL | Status: DC

## 2020-07-30 MED ORDER — ONDANSETRON HCL 4 MG/2ML IJ SOLN: 4.0000 mg | Freq: Four times a day (QID) | INTRAMUSCULAR | Status: DC | PRN

## 2020-07-30 MED ORDER — INSULIN ASPART 100 UNIT/ML IJ SOLN
0.0000 [IU] | Freq: Three times a day (TID) | INTRAMUSCULAR | Status: DC
  Administered 2020-07-31: 3 [IU] via SUBCUTANEOUS
  Administered 2020-07-31: 2 [IU] via SUBCUTANEOUS

## 2020-07-30 MED ORDER — MORPHINE SULFATE (PF) 2 MG/ML IV SOLN
2.0000 mg | INTRAVENOUS | Status: DC | PRN
  Administered 2020-07-31: 2 mg via INTRAVENOUS
  Filled 2020-07-30: qty 1

## 2020-07-30 MED ORDER — METFORMIN HCL ER 500 MG PO TB24
1000.0000 mg | ORAL_TABLET | Freq: Every day | ORAL | Status: DC
  Administered 2020-07-31: 1000 mg via ORAL
  Filled 2020-07-30 (×2): qty 2

## 2020-07-30 MED ORDER — INSULIN ASPART 100 UNIT/ML IJ SOLN
4.0000 [IU] | Freq: Three times a day (TID) | INTRAMUSCULAR | Status: DC
  Administered 2020-07-31 (×2): 4 [IU] via SUBCUTANEOUS

## 2020-07-30 MED ORDER — LIDOCAINE 5 % EX PTCH
1.0000 | MEDICATED_PATCH | CUTANEOUS | Status: DC
  Administered 2020-07-30: 1 via TRANSDERMAL
  Filled 2020-07-30 (×2): qty 1

## 2020-07-30 MED ORDER — FENTANYL CITRATE (PF) 100 MCG/2ML IJ SOLN
50.0000 ug | Freq: Once | INTRAMUSCULAR | Status: AC
  Administered 2020-07-30: 50 ug via INTRAVENOUS
  Filled 2020-07-30: qty 2

## 2020-07-30 MED ORDER — DAPAGLIFLOZIN PRO-METFORMIN ER 10-1000 MG PO TB24
1.0000 | ORAL_TABLET | Freq: Every day | ORAL | Status: DC
Start: 2020-07-31 — End: 2020-07-30

## 2020-07-30 MED ORDER — ACETAMINOPHEN 500 MG PO TABS
1000.0000 mg | ORAL_TABLET | Freq: Four times a day (QID) | ORAL | Status: DC
  Administered 2020-07-30 – 2020-07-31 (×3): 1000 mg via ORAL
  Filled 2020-07-30 (×3): qty 2

## 2020-07-30 MED ORDER — LIDOCAINE 5 % EX PTCH: 1.0000 | MEDICATED_PATCH | CUTANEOUS | Status: DC

## 2020-07-30 MED ORDER — VALSARTAN-HYDROCHLOROTHIAZIDE 320-25 MG PO TABS
1.0000 | ORAL_TABLET | Freq: Every morning | ORAL | Status: DC
Start: 2020-07-31 — End: 2020-07-30

## 2020-07-30 MED ORDER — ONDANSETRON 4 MG PO TBDP: 4.0000 mg | ORAL_TABLET | Freq: Four times a day (QID) | ORAL | Status: DC | PRN

## 2020-07-30 MED ORDER — IOHEXOL 300 MG/ML  SOLN
75.0000 mL | Freq: Once | INTRAMUSCULAR | Status: AC | PRN
  Administered 2020-07-30: 75 mL via INTRAVENOUS

## 2020-07-30 MED ORDER — TAMSULOSIN HCL 0.4 MG PO CAPS: 0.4000 mg | ORAL_CAPSULE | Freq: Every day | ORAL | Status: DC

## 2020-07-30 MED ORDER — METHOCARBAMOL 750 MG PO TABS
750.0000 mg | ORAL_TABLET | Freq: Three times a day (TID) | ORAL | Status: DC
  Administered 2020-07-30 – 2020-07-31 (×3): 750 mg via ORAL
  Filled 2020-07-30 (×3): qty 1

## 2020-07-30 MED ORDER — SIMVASTATIN 20 MG PO TABS
40.0000 mg | ORAL_TABLET | Freq: Every day | ORAL | Status: DC
  Administered 2020-07-30: 40 mg via ORAL
  Filled 2020-07-30: qty 2

## 2020-07-30 MED ORDER — OXYCODONE-ACETAMINOPHEN 5-325 MG PO TABS
1.0000 | ORAL_TABLET | Freq: Once | ORAL | Status: DC
  Filled 2020-07-30: qty 1

## 2020-07-30 MED ORDER — DAPAGLIFLOZIN PROPANEDIOL 10 MG PO TABS
10.0000 mg | ORAL_TABLET | Freq: Every day | ORAL | Status: DC
  Administered 2020-07-31: 10 mg via ORAL
  Filled 2020-07-30: qty 1

## 2020-07-30 MED ORDER — INSULIN ASPART 100 UNIT/ML IJ SOLN
0.0000 [IU] | Freq: Every day | INTRAMUSCULAR | Status: DC
  Administered 2020-07-30: 2 [IU] via SUBCUTANEOUS

## 2020-07-30 MED ORDER — OXYCODONE HCL 5 MG PO TABS
10.0000 mg | ORAL_TABLET | ORAL | Status: DC | PRN
  Administered 2020-07-31 (×2): 10 mg via ORAL
  Filled 2020-07-30 (×3): qty 2

## 2020-07-30 MED ORDER — ENOXAPARIN SODIUM 30 MG/0.3ML IJ SOSY
30.0000 mg | PREFILLED_SYRINGE | Freq: Two times a day (BID) | INTRAMUSCULAR | Status: DC
  Administered 2020-07-31: 30 mg via SUBCUTANEOUS
  Filled 2020-07-30: qty 0.3

## 2020-07-30 NOTE — ED Notes (Signed)
Urine sent to lab in triage if needed later.

## 2020-07-30 NOTE — ED Triage Notes (Signed)
Pt reports was walking across street towards [place] this morning and fell. C/o entire left side pains and right knee. Hurts on left side when breathes

## 2020-07-30 NOTE — ED Notes (Signed)
Attempted to call report. Will call again later. 

## 2020-07-30 NOTE — TOC CAGE-AID Note (Signed)
Transition of Care [facility]) - CAGE-AID Screening   Patient Details  Name: Stephen Gray. MRN: [id number] Date of Birth: 05-18-1945  Transition of Care [facility]) CM/SW Contact:    Janora Norlander, RN Phone Number: [phone] 07/30/2020, 9:22 PM   Clinical Narrative: Pt fall w/ L rib fx.  Pt denies alcohol or drug use.   CAGE-AID Screening:    Have You Ever Felt You Ought to Cut Down on Your Drinking or Drug Use?: No Have People Annoyed You By Critizing Your Drinking Or Drug Use?: No Have You Felt Bad Or Guilty About Your Drinking Or Drug Use?: No Have You Ever Had a Drink or Used Drugs First Thing In The Morning to Steady Your Nerves or to Get Rid of a Hangover?: No CAGE-AID Score: 0  Substance Abuse Education Offered: No

## 2020-07-30 NOTE — ED Provider Notes (Signed)
[facility]-EMERGENCY DEPT Provider Note   CSN: [id number] Arrival date & time: 07/30/20  1254     History Chief Complaint  Patient presents with  . Fall    Stephen Gray. is a 75 y.o. male past medical history of hypertension, hyperlipidemia, type 2 diabetes, presenting for evaluation after mechanical fall.  Patient states he was walking across the street while drinking a bottle of water.  He tripped and fell onto his left side.  His left arm was tucked under him.  He had sudden onset of left chest wall pain that is worse with deep breathing.  Not particularly short of breath.  He did not hit his head or lose consciousness.  He denies abdominal pain or any other injuries.  Not on anticoagulation.  The history is provided by the patient.       Past Medical History:  Diagnosis Date  . Anxiety   . Diabetes (HCC)   . GERD (gastroesophageal reflux disease)   . Hypercholesteremia   . Hypertension     Patient Active Problem List   Diagnosis Date Noted  . Iron deficiency anemia 04/17/2020  . Bradycardia 04/01/2020  . Deficiency anemia 03/20/2020  . Type 2 diabetes mellitus without complication, without long-term current use of insulin (HCC) 03/19/2020  . Hyperlipidemia with target LDL less than 100 03/19/2020  . Primary hypertension 03/19/2020  . Benign prostatic hyperplasia without lower urinary tract symptoms 03/19/2020  . Onychomycosis of right great toe 03/19/2020  . Subareolar gynecomastia in male 03/19/2020  . B12 deficiency due to diet 03/19/2020  . Cataract 04/23/2014    Past Surgical History:  Procedure Laterality Date  . TESTICULAR EXPLORATION         Family History  Problem Relation Age of Onset  . Diabetes Mother   . Kidney disease Father   . Other Neg Hx        gynecomastia    Social History   Tobacco Use  . Smoking status: Former Games developer  . Smokeless tobacco: Never Used  Substance Use Topics  . Alcohol use: Not Currently   . Drug use: Not Currently    Home Medications Prior to Admission medications   Medication Sig Start Date End Date Taking? Authorizing Provider  amLODipine (NORVASC) 5 MG tablet Take 1 tablet (5 mg total) by mouth every evening. 04/01/20 06/30/20  Tolia, Sunit, DO  Cholecalciferol (VITAMIN D-3) 25 MCG (1000 UT) CAPS Take by mouth.    [provider]  Dapagliflozin-metFORMIN HCl ER (XIGDUO XR) 12-998 MG TB24 Take 1 tablet by mouth daily. 03/20/20   Etta Grandchild, MD  Multiple Vitamin (MULTIVITAMIN ADULT PO) Take by mouth.    [provider]  simvastatin (ZOCOR) 40 MG tablet Take 1 tablet (40 mg total) by mouth at bedtime. 03/25/20   Etta Grandchild, MD  tamsulosin (FLOMAX) 0.4 MG CAPS capsule Take 1 capsule (0.4 mg total) by mouth daily after supper. 03/25/20   Etta Grandchild, MD  valsartan-hydrochlorothiazide (DIOVAN-HCT) 320-25 MG tablet Take 1 tablet by mouth in the morning. 04/01/20   Tolia, Sunit, DO    Allergies    Shrimp flavor  Review of Systems   Review of Systems  Cardiovascular:       Chest wall pain  All other systems reviewed and are negative.   Physical Exam Updated Vital Signs BP (!) 160/86   Pulse 88   Temp 98.4 F (36.9 C) (Oral)   Resp 18   SpO2 92%  Physical Exam Vitals and nursing note reviewed.  Constitutional:      Appearance: He is well-developed.  HENT:     Head: Normocephalic and atraumatic.  Eyes:     Conjunctiva/sclera: Conjunctivae normal.  Cardiovascular:     Rate and Rhythm: Normal rate and regular rhythm.     Pulses: Normal pulses.  Pulmonary:     Effort: Pulmonary effort is normal. No respiratory distress.     Breath sounds: Normal breath sounds.     Comments: No subcutaneous crepitus to the chest wall.  Symmetric chest expansion.  Tenderness to the left anterior mid chest wall with palpation Chest:     Chest wall: Tenderness present.  Abdominal:     General: Bowel sounds are normal.     Palpations: Abdomen is soft.      Tenderness: There is no abdominal tenderness.     Comments: No bruising or tenderness to the abdomen.  Musculoskeletal:     Cervical back: Normal range of motion and neck supple.  Skin:    General: Skin is warm.  Neurological:     Mental Status: He is alert.  Psychiatric:        Behavior: Behavior normal.     ED Results / Procedures / Treatments   Labs (all labs ordered are listed, but only abnormal results are displayed) Labs Reviewed  CBC WITH DIFFERENTIAL/PLATELET - Abnormal; Notable for the following components:      Result Value   WBC 10.7 (*)    RBC 4.12 (*)    Neutro Abs 9.4 (*)    All other components within normal limits  COMPREHENSIVE METABOLIC PANEL - Abnormal; Notable for the following components:   Potassium 3.2 (*)    Chloride 97 (*)    Glucose, Bld 423 (*)    All other components within normal limits  RESP PANEL BY RT-PCR (FLU A&B, COVID) [id number]  URINALYSIS, ROUTINE W REFLEX MICROSCOPIC  RAPID URINE DRUG SCREEN, HOSP PERFORMED    EKG EKG Interpretation  Date/Time:  Tuesday Jul 30 2020 13:38:49 EDT Ventricular Rate:  89 PR Interval:  144 QRS Duration: 88 QT Interval:  388 QTC Calculation: 472 R Axis:   60 Text Interpretation: Normal sinus rhythm Minimal voltage criteria for LVH, may be normal variant ( Sokolow-Lyon ) Nonspecific ST and T wave abnormality Abnormal ECG Confirmed by Kristine Royal [phone]) on 07/30/2020 5:14:00 PM   Radiology DG Ribs Unilateral W/Chest Left  Result Date: 07/30/2020 CLINICAL DATA:  Fall.  RIGHT-sided rib and chest pain. EXAM: LEFT RIBS AND CHEST - 3+ VIEW COMPARISON:  None. FINDINGS: The heart size is mildly enlarged. The thoracic aorta is tortuous. Superimposed over the aortic knob, there is a double density, raising the question of saccular aneurysm. The lungs are clear. Mediastinal contour is otherwise normal. Minimal atelectasis at the LEFT lung base. There is no pneumothorax. Oblique views of the ribs show acute  fractures of the LEFT fourth and 5th ribs. IMPRESSION: Acute fractures of the LEFT fourth and 5th ribs. Abnormal aortic knob contour, raising question of aneurysm. Recommend further evaluation with CT of the chest. These results were called by telephone at the time of interpretation on 07/30/2020 at 2:19 pm to provider [facility] , who verbally acknowledged these results. Electronically Signed   By: Norva Pavlov M.D.   On: 07/30/2020 14:22   CT Head Wo Contrast  Result Date: 07/30/2020 CLINICAL DATA:  Head trauma, minor. Additional provided: Fall earlier today. EXAM: CT HEAD WITHOUT CONTRAST TECHNIQUE: Contiguous  axial images were obtained from the base of the skull through the vertex without intravenous contrast. COMPARISON:  No pertinent prior exams available for comparison. FINDINGS: Brain: Mild generalized parenchymal atrophy. Mild ill-defined hypoattenuation within the cerebral white matter is nonspecific, but compatible with chronic small vessel ischemic disease. There is no acute intracranial hemorrhage. No demarcated cortical infarct. No extra-axial fluid collection. No evidence of intracranial mass. No midline shift. Vascular: No hyperdense vessel.  Atherosclerotic calcifications. Skull: Normal. Negative for fracture or focal lesion. Sinuses/Orbits: Visualized orbits show no acute finding. Mild bilateral ethmoid sinus mucosal thickening. IMPRESSION: No evidence of acute intracranial abnormality. Mild generalized parenchymal atrophy and cerebral white matter chronic small vessel ischemic disease. Mild bilateral ethmoid sinus mucosal thickening. Electronically Signed   By: Jackey Loge DO   On: 07/30/2020 14:58   CT Chest W Contrast  Result Date: 07/30/2020 CLINICAL DATA:  Left-sided chest pain difficulty breathing EXAM: CT CHEST WITH CONTRAST TECHNIQUE: Multidetector CT imaging of the chest was performed during intravenous contrast administration. CONTRAST:  17mL OMNIPAQUE IOHEXOL 300 MG/ML   SOLN COMPARISON:  Radiograph 07/30/2020 FINDINGS: Cardiovascular: Ectatic aorta without definitive aneurysm. Mild aortic atherosclerosis. Coronary vascular calcification. Mild cardiomegaly with small to moderate pericardial effusion, measuring up to 2 cm maximum thickness anteriorly. Prominent azygos vein draining into the superior vena cava. Hemi azygous and accessory hemi azygous veins with hemi azygous vein draining into the brachiocephalic vein on the left. This contributes to the opacity noted on radiography. Mediastinum/Nodes: Midline trachea. No suspicious thyroid mass. No suspicious nodes. Esophagus within normal limits Lungs/Pleura: No pleural effusion. Dependent atelectasis. Small left anterior and basilar pneumothorax, estimated at less than 10%. Questionable small foci of extrapleural air posteriorly on the right. Upper Abdomen: Right renal cyst.  No acute abnormality. Musculoskeletal: Sternum is intact. Thoracic vertebral bodies demonstrate normal stature. Acute mildly displaced left third, fourth, and fifth anterolateral rib fractures. Small amount of subcutaneous emphysema in left chest wall. Prominent gynecomastia. IMPRESSION: 1. Acute left third fourth and fifth rib fractures with small left anterior and basilar pneumothorax estimated at less than 10%. Small amount of chest wall emphysema. 2. Questionable small foci of extrapleural air in the right posterior thorax though no definitive rib fracture. 3. Abnormal opacity on radiography appears to correspond to hemi azygous venous variant. There is no aortic aneurysm. Critical Value/emergent results were called by telephone at the time of interpretation on 07/30/2020 at 5:05 pm to provider [facility] , who verbally acknowledged these results. Aortic Atherosclerosis (ICD10-I70.0). Electronically Signed   By: Jasmine Pang M.D.   On: 07/30/2020 17:05    Procedures Procedures   Medications Ordered in ED Medications  lidocaine (LIDODERM) 5 % 1  patch (1 patch Transdermal Patch Applied 07/30/20 1555)  potassium chloride (KLOR-CON) packet 40 mEq (has no administration in time range)  acetaminophen (TYLENOL) tablet 650 mg (650 mg Oral Given 07/30/20 1556)  iohexol (OMNIPAQUE) 300 MG/ML solution 75 mL (75 mLs Intravenous Contrast Given 07/30/20 1624)  fentaNYL (SUBLIMAZE) injection 50 mcg (50 mcg Intravenous Given 07/30/20 1738)    ED Course  I have reviewed the triage vital signs and the nursing notes.  Pertinent labs & imaging results that were available during my care of the patient were reviewed by me and considered in my medical decision making (see chart for details).  Clinical Course as of 07/30/20 1804  Tue Jul 30, 2020  1725 Dr. Dwain Sarna with surgery consulted. Patient will be admitted to trauma service at [facility] for close monitoring. [  JR]    Clinical Course User Index [JR] Tylik Treese, [place] N, PA-C   MDM Rules/Calculators/A&P                          Patient is here with chest wall pain after mechanical fall onto his left side while walking across the street.  Not on anticoagulation.  No head trauma.  He is not short of breath though is having quite a bit of pain with breathing.  He is not hypoxic.  Plain film of the chest shows multiple rib fractures.  Follow-up CT scan of the chest shows left third through fifth rib fractures with small pneumothorax.  Also pericardial effusion is noted in body of CT interpretation. At this time, patient does not require chest tube. CT head ordered in triage is negative.   Pain is treated here.  Considering patient's extent of injury, consult was placed to Dr. Dwain SarnaWakefield with surgery.  [facility] to the trauma service for close monitoring and pain management.  Patient is in agreement with this care plan.  Final Clinical Impression(s) / ED Diagnoses Final diagnoses:  Multiple fractures of ribs, left side, initial encounter for closed fracture  Traumatic pneumothorax  without open wound into thorax, initial encounter  Pericardial effusion    Rx / DC Orders ED Discharge Orders    None       Jessey Huyett, [place] N, PA-C 07/30/20 1804    Wynetta FinesMessick, Peter C, MD 07/31/20 [phone]

## 2020-07-30 NOTE — H&P (Addendum)
Stephen Gray. is an 75 y.o. male.   Chief Complaint: fall with left rib pain HPI: 75 yom with dm, htn, hypercholesterolemia presents after fall when walking today.  Felt left chest pain. No loc.  Went back to his room and said pain would not go away, got worse and was feeling sob and came to er.  No other complaints.  He underwent er evaluation and was found to have left rib fx with ct only ptx and I was asked to see him. His glucose is also 423 on evaluation   Past Medical History:  Diagnosis Date  . Anxiety   . Diabetes (HCC)   . GERD (gastroesophageal reflux disease)   . Hypercholesteremia   . Hypertension     Past Surgical History:  Procedure Laterality Date  . TESTICULAR EXPLORATION      Family History  Problem Relation Age of Onset  . Diabetes Mother   . Kidney disease Father   . Other Neg Hx        gynecomastia   Social History:  reports that he has quit smoking. He has never used smokeless tobacco. He reports previous alcohol use. He reports previous drug use.  Allergies:  Allergies  Allergen Reactions  . Shrimp Flavor Anaphylaxis, Hives, Shortness Of Breath, Itching, Nausea And Vomiting, Swelling and Rash    Home meds reviewed  Results for orders placed or performed during the hospital encounter of 07/30/20 (from the past 48 hour(s))  CBC with Differential     Status: Abnormal   Collection Time: 07/30/20  2:35 PM  Result Value Ref Range   WBC 10.7 (H) 4.0 - 10.5 K/uL   RBC 4.12 (L) 4.22 - 5.81 MIL/uL   Hemoglobin 13.1 13.0 - 17.0 g/dL   HCT 53.6 64.4 - 03.4 %   MCV 94.7 80.0 - 100.0 fL   MCH 31.8 26.0 - 34.0 pg   MCHC 33.6 30.0 - 36.0 g/dL   RDW 74.2 59.5 - 63.8 %   Platelets 245 150 - 400 K/uL   nRBC 0.0 0.0 - 0.2 %   Neutrophils Relative % 88 %   Neutro Abs 9.4 (H) 1.7 - 7.7 K/uL   Lymphocytes Relative 7 %   Lymphs Abs 0.8 0.7 - 4.0 K/uL   Monocytes Relative 5 %   Monocytes Absolute 0.5 0.1 - 1.0 K/uL   Eosinophils Relative 0 %   Eosinophils  Absolute 0.0 0.0 - 0.5 K/uL   Basophils Relative 0 %   Basophils Absolute 0.0 0.0 - 0.1 K/uL   Immature Granulocytes 0 %   Abs Immature Granulocytes 0.04 0.00 - 0.07 K/uL    Comment: Performed at [facility], [address], [place], [place] [address]  Comprehensive metabolic panel     Status: Abnormal   Collection Time: 07/30/20  2:35 PM  Result Value Ref Range   Sodium 138 135 - 145 mmol/L   Potassium 3.2 (L) 3.5 - 5.1 mmol/L   Chloride 97 (L) 98 - 111 mmol/L   CO2 27 22 - 32 mmol/L   Glucose, Bld 423 (H) 70 - 99 mg/dL    Comment: Glucose reference range applies only to samples taken after fasting for at least 8 hours.   BUN 20 8 - 23 mg/dL   Creatinine, Ser 3.29 0.61 - 1.24 mg/dL   Calcium 9.0 8.9 - 51.8 mg/dL   Total Protein 7.1 6.5 - 8.1 g/dL   Albumin 3.7 3.5 - 5.0 g/dL   AST  22 15 - 41 U/L   ALT 16 0 - 44 U/L   Alkaline Phosphatase 77 38 - 126 U/L   Total Bilirubin 0.5 0.3 - 1.2 mg/dL   GFR, Estimated >32 >67 mL/min    Comment: (NOTE) Calculated using the CKD-EPI Creatinine Equation (2021)    Anion gap 14 5 - 15    Comment: Performed at [facility], [address], [place], [place] [address]   DG Ribs Unilateral W/Chest Left  Result Date: 07/30/2020 CLINICAL DATA:  Fall.  RIGHT-sided rib and chest pain. EXAM: LEFT RIBS AND CHEST - 3+ VIEW COMPARISON:  None. FINDINGS: The heart size is mildly enlarged. The thoracic aorta is tortuous. Superimposed over the aortic knob, there is a double density, raising the question of saccular aneurysm. The lungs are clear. Mediastinal contour is otherwise normal. Minimal atelectasis at the LEFT lung base. There is no pneumothorax. Oblique views of the ribs show acute fractures of the LEFT fourth and 5th ribs. IMPRESSION: Acute fractures of the LEFT fourth and 5th ribs. Abnormal aortic knob contour, raising question of aneurysm. Recommend further evaluation with CT of the chest. These results were called  by telephone at the time of interpretation on 07/30/2020 at 2:19 pm to provider [facility] , who verbally acknowledged these results. Electronically Signed   By: Norva Pavlov M.D.   On: 07/30/2020 14:22   CT Head Wo Contrast  Result Date: 07/30/2020 CLINICAL DATA:  Head trauma, minor. Additional provided: Fall earlier today. EXAM: CT HEAD WITHOUT CONTRAST TECHNIQUE: Contiguous axial images were obtained from the base of the skull through the vertex without intravenous contrast. COMPARISON:  No pertinent prior exams available for comparison. FINDINGS: Brain: Mild generalized parenchymal atrophy. Mild ill-defined hypoattenuation within the cerebral white matter is nonspecific, but compatible with chronic small vessel ischemic disease. There is no acute intracranial hemorrhage. No demarcated cortical infarct. No extra-axial fluid collection. No evidence of intracranial mass. No midline shift. Vascular: No hyperdense vessel.  Atherosclerotic calcifications. Skull: Normal. Negative for fracture or focal lesion. Sinuses/Orbits: Visualized orbits show no acute finding. Mild bilateral ethmoid sinus mucosal thickening. IMPRESSION: No evidence of acute intracranial abnormality. Mild generalized parenchymal atrophy and cerebral white matter chronic small vessel ischemic disease. Mild bilateral ethmoid sinus mucosal thickening. Electronically Signed   By: Jackey Loge DO   On: 07/30/2020 14:58   CT Chest W Contrast  Result Date: 07/30/2020 CLINICAL DATA:  Left-sided chest pain difficulty breathing EXAM: CT CHEST WITH CONTRAST TECHNIQUE: Multidetector CT imaging of the chest was performed during intravenous contrast administration. CONTRAST:  98mL OMNIPAQUE IOHEXOL 300 MG/ML  SOLN COMPARISON:  Radiograph 07/30/2020 FINDINGS: Cardiovascular: Ectatic aorta without definitive aneurysm. Mild aortic atherosclerosis. Coronary vascular calcification. Mild cardiomegaly with small to moderate pericardial effusion,  measuring up to 2 cm maximum thickness anteriorly. Prominent azygos vein draining into the superior vena cava. Hemi azygous and accessory hemi azygous veins with hemi azygous vein draining into the brachiocephalic vein on the left. This contributes to the opacity noted on radiography. Mediastinum/Nodes: Midline trachea. No suspicious thyroid mass. No suspicious nodes. Esophagus within normal limits Lungs/Pleura: No pleural effusion. Dependent atelectasis. Small left anterior and basilar pneumothorax, estimated at less than 10%. Questionable small foci of extrapleural air posteriorly on the right. Upper Abdomen: Right renal cyst.  No acute abnormality. Musculoskeletal: Sternum is intact. Thoracic vertebral bodies demonstrate normal stature. Acute mildly displaced left third, fourth, and fifth anterolateral rib fractures. Small amount of subcutaneous emphysema in left chest wall.  Prominent gynecomastia. IMPRESSION: 1. Acute left third fourth and fifth rib fractures with small left anterior and basilar pneumothorax estimated at less than 10%. Small amount of chest wall emphysema. 2. Questionable small foci of extrapleural air in the right posterior thorax though no definitive rib fracture. 3. Abnormal opacity on radiography appears to correspond to hemi azygous venous variant. There is no aortic aneurysm. Critical Value/emergent results were called by telephone at the time of interpretation on 07/30/2020 at 5:05 pm to provider [facility] , who verbally acknowledged these results. Aortic Atherosclerosis (ICD10-I70.0). Electronically Signed   By: Jasmine Pang M.D.   On: 07/30/2020 17:05    Review of Systems  Constitutional: Negative for fever.  Respiratory: Positive for shortness of breath. Negative for chest tightness.   Cardiovascular: Negative for chest pain and leg swelling.  Gastrointestinal: Negative for abdominal pain.    Blood pressure (!) 160/86, pulse 88, temperature 98.4 F (36.9 C),  temperature source Oral, resp. rate 18, SpO2 92 %. Physical Exam Constitutional:      General: He is not in acute distress.    Appearance: Normal appearance.  HENT:     Head: Normocephalic and atraumatic.     Right Ear: External ear normal.     Left Ear: External ear normal.     Nose: Nose normal.     Mouth/Throat:     Mouth: Mucous membranes are moist.     Pharynx: Oropharynx is clear.  Eyes:     General: No scleral icterus.    Extraocular Movements: Extraocular movements intact.     Pupils: Pupils are equal, round, and reactive to light.  Cardiovascular:     Rate and Rhythm: Normal rate and regular rhythm.  Pulmonary:     Effort: Pulmonary effort is normal.     Breath sounds: Normal breath sounds.     Comments: Left sided chest pain reproducible  Abdominal:     Palpations: Abdomen is soft.     Tenderness: There is no abdominal tenderness.  Musculoskeletal:        General: No tenderness, deformity or signs of injury. Normal range of motion.     Cervical back: Normal range of motion and neck supple. No rigidity or tenderness.     Right lower leg: No edema.     Left lower leg: No edema.  Skin:    General: Skin is warm and dry.     Capillary Refill: Capillary refill takes less than 2 seconds.  Neurological:     General: No focal deficit present.     Mental Status: He is alert and oriented to person, place, and time.  Psychiatric:        Mood and Affect: Mood normal.        Behavior: Behavior normal.      Assessment/Plan Fall Left 4-5 rib fx with small ct only ptx- pain control, pulmonary toilet, repeat cxr in am Pericardial effusion- doubt this has anything to do with injury, will order echo, rhythm normal on monitor DM- ssi, home meds, follow glucose, check A1C, have diabetes coordinator to see HTN- home meds Hypokalemia-replete check in am Lovenox, scds tx to [facility] trauma service  covid pending  Emelia Loron, MD 07/30/2020, 6:03 PM

## 2020-07-30 NOTE — ED Notes (Addendum)
Attempted to call report. Will call again later.

## 2020-07-30 NOTE — ED Provider Notes (Addendum)
Emergency Medicine Provider Triage Evaluation Note  Stephen Gray. , a 75 y.o. male  was evaluated in triage.  Pt complains of left upper chest pain after a fall.  Patient states he leaned back to drink some water from a water bottle when he fell, landing on his left side.  He was reports initially he felt little bit dizzy, however this resolved after he sat down.  He was able to walk around town without difficulty, but when he tried to lay down, he had severe pain in his left side.  He reports since then, his pain has been consistent, worse when he takes a deep breath in.  He has not taken anything for pain including Tylenol or ibuprofen.  He is not on blood thinners.  He denies headache currently.  No neck or back pain.  No numbness or tingling.  Review of Systems  Positive: Dizziness (resolved), L upper chest pain Negative: HA, neck pain  Physical Exam  BP 125/80 (BP Location: Right Arm)   Pulse 94   Temp 98.4 F (36.9 C) (Oral)   Resp 18   SpO2 95%  Gen:   Awake, no distress   Resp:  Normal effort  MSK:   Moves extremities without difficulty Other:  Tenderness palpation of the left upper chest wall.  Clear lung sounds.  Speaking full sentences.  No tenderness palpation of back or midline spine.  Grip strength equal bilaterally.  Good distal sensation.  Full active range of motion of the left upper extremity without difficulty  Medical Decision Making  Medically screening exam initiated at 1:23 PM.  Appropriate orders placed.  [name] was informed that the remainder of the evaluation will be completed by another provider, this initial triage assessment does not replace that evaluation, and the importance of remaining in the ED until their evaluation is complete.  Pt with pain after a fall. Sounds like a mechanical fall, but due to age, will order ekg and ct head   Alveria Apley, PA-C 07/30/20 1325   2:23 PM Informed by radiology that patient has multiple rib  fractures on the left.  He also has concerning contour of his aorta which is likely due to chronic disease, however in the setting of trauma recommends chest CT.  Order placed.    Alveria Apley, PA-C 07/30/20 1423    Arby Barrette, MD 08/08/20 [phone]

## 2020-07-31 ENCOUNTER — Observation Stay (HOSPITAL_COMMUNITY): Payer: Medicare Other

## 2020-07-31 ENCOUNTER — Other Ambulatory Visit (HOSPITAL_COMMUNITY): Payer: Self-pay

## 2020-07-31 DIAGNOSIS — E876 Hypokalemia: Secondary | ICD-10-CM | POA: Diagnosis not present

## 2020-07-31 DIAGNOSIS — S2242XA Multiple fractures of ribs, left side, initial encounter for closed fracture: Secondary | ICD-10-CM | POA: Diagnosis not present

## 2020-07-31 DIAGNOSIS — S270XXA Traumatic pneumothorax, initial encounter: Secondary | ICD-10-CM | POA: Diagnosis not present

## 2020-07-31 DIAGNOSIS — J939 Pneumothorax, unspecified: Secondary | ICD-10-CM | POA: Diagnosis not present

## 2020-07-31 DIAGNOSIS — U071 COVID-19: Secondary | ICD-10-CM | POA: Diagnosis not present

## 2020-07-31 LAB — BASIC METABOLIC PANEL
Anion gap: 10 (ref 5–15)
BUN: 19 mg/dL (ref 8–23)
CO2: 28 mmol/L (ref 22–32)
Calcium: 8.9 mg/dL (ref 8.9–10.3)
Chloride: 98 mmol/L (ref 98–111)
Creatinine, Ser: 0.94 mg/dL (ref 0.61–1.24)
GFR, Estimated: >60 mL/min (ref >60)
Glucose, Bld: 279 mg/dL — ABNORMAL HIGH (ref 70–99)
Potassium: 3.3 mmol/L — ABNORMAL LOW (ref 3.5–5.1)
Sodium: 136 mmol/L (ref 135–145)

## 2020-07-31 LAB — GLUCOSE, CAPILLARY
Glucose-Capillary: 188 mg/dL — ABNORMAL HIGH (ref 70–99)
Glucose-Capillary: 182 mg/dL — ABNORMAL HIGH (ref 70–99)

## 2020-07-31 LAB — HEMOGLOBIN A1C
Hgb A1c MFr Bld: 10.5 % — ABNORMAL HIGH (ref 4.8–5.6)
Mean Plasma Glucose: 254.65 mg/dL

## 2020-07-31 MED ORDER — POTASSIUM CHLORIDE 20 MEQ PO PACK
40.0000 meq | PACK | Freq: Two times a day (BID) | ORAL | Status: DC
  Administered 2020-07-31: 40 meq via ORAL
  Filled 2020-07-31: qty 2

## 2020-07-31 MED ORDER — ACETAMINOPHEN 500 MG PO TABS
1000.0000 mg | ORAL_TABLET | Freq: Four times a day (QID) | ORAL | 0 refills | Status: AC
Start: 2020-07-31 — End: ?
  Filled 2020-07-31: qty 30, 4d supply, fill #0

## 2020-07-31 MED ORDER — DOCUSATE SODIUM 100 MG PO CAPS
100.0000 mg | ORAL_CAPSULE | Freq: Two times a day (BID) | ORAL | 0 refills | Status: DC
  Filled 2020-07-31: qty 10, 5d supply, fill #0

## 2020-07-31 MED ORDER — METHOCARBAMOL 750 MG PO TABS
750.0000 mg | ORAL_TABLET | Freq: Three times a day (TID) | ORAL | 0 refills | Status: DC | PRN
  Filled 2020-07-31: qty 30, 10d supply, fill #0

## 2020-07-31 MED ORDER — OXYCODONE HCL 10 MG PO TABS
10.0000 mg | ORAL_TABLET | Freq: Four times a day (QID) | ORAL | 0 refills | Status: AC | PRN
  Filled 2020-07-31: qty 28, 7d supply, fill #0

## 2020-07-31 NOTE — Progress Notes (Signed)
A &Ox 4, Pain rating 5/10 at discharge.   Diabetes educator requested to see pt prior to dicharge. Notified SW  And provider that daughter wanted his discharge destination to be evaluated prior to discharge. This RN advised daughter that pt is medically approrate to be discharged to his hotel and the RN will notify SW to call her. Pt who is alert and oriented was very okay to be discharged back to his hotel. Pt stated he didn't need any extra social resources from his stand point. RN provided complimentary cab voucher and pt was escorted via wheel chair to the cab. Marland Kitchen

## 2020-07-31 NOTE — Discharge Instructions (Signed)
Rib Fracture  A rib fracture is a break or crack in one of the bones of the ribs. The ribs are like a cage that goes around your upper chest. A broken or cracked rib is often painful, but most do not cause other problems. Most rib fractures usually heal on their own in 1-3 months. What are the causes?  Doing movements over and over again with a lot of force, such as pitching a baseball or having a very bad cough.  A direct hit to the chest.  Cancer that has spread to the bones. What are the signs or symptoms?  Pain when you breathe in or cough.  Pain when someone presses on the injured area.  Feeling short of breath. How is this treated? Treatment depends on how bad the fracture is. In general:  Most rib fractures usually heal on their own in 1-3 months.  Healing may take longer if you have a cough or are doing activities that make the injury worse.  While you heal, you may be given medicines to control pain.  You will also be taught deep breathing exercises.  Very bad injuries may require a stay at the hospital or surgery. Follow these instructions at home: Managing pain, stiffness, and swelling  If told, put ice on the injured area. To do this: ? Put ice in a plastic bag. ? Place a towel between your skin and the bag. ? Leave the ice on for 20 minutes, 2-3 times a day. ? Take off the ice if your skin turns bright red. This is very important. If you cannot feel pain, heat, or cold, you have a greater risk of damage to the area.  Take over-the-counter and prescription medicines only as told by your doctor. Activity  Avoid activities that cause pain to the injured area. Protect your injured area.  Slowly increase activity as told by your doctor. General instructions  Do deep breathing exercises as told by your doctor. You may be told to: ? Take deep breaths many times a day. ? Cough several times a day while hugging a pillow. ? Use a device (incentive spirometer) to do  deep breathing many times a day.  Drink enough fluid to keep your pee (urine) clear or pale yellow.  Do not wear a rib belt or binder.  Keep all follow-up visits. Contact a doctor if:  You have a fever. Get help right away if:  You have trouble breathing.  You are short of breath.  You cannot stop coughing.  You cough up thick or bloody spit.  You feel like you may vomit (nauseous), vomit, or have belly (abdominal) pain.  Your pain gets worse and medicine does not help. These symptoms may be an emergency. Get help right away. Call your local emergency services (911 in the [place]  Do not wait to see if the symptoms will go away.  Do not drive yourself to the hospital. Summary  A rib fracture is a break or crack in one of the bones of the ribs.  Apply ice to the injured area and take medicines for pain as told by your doctor.  Take deep breaths and cough several times a day. Hug a pillow every time you cough. This information is not intended to replace advice given to you by your health care provider. Make sure you discuss any questions you have with your health care provider. Document Revised: 06/30/2019 Document Reviewed: 06/30/2019 Elsevier Patient Education  2021 [employer]  

## 2020-07-31 NOTE — Evaluation (Signed)
Occupational Therapy Evaluation and Discharge Patient Details Name: Stephen [name]. MRN: [id number] DOB: [birth year] Today's Date: 07/31/2020    History of Present Illness Pt is a 75 y.o. male who presented 5/10 with L 4-5 rib fx with small left  anterior and basilar pneumothorax estimated at less than 10% secondary to a fall. No evidence of acute intracranial abnormality. PMH: HTN, GERD, DM, and anxiety.   Clinical Impression   Pt admitted to the ED for concerns listed above. PTA pt reported independence with all ADL's and IADL's. He lives in a hotel at this time with an elevator and uses a cab or walking for transportation/community mobility. At the time of the evaluation, pt demonstrated continued independence with all ADL's, pt was able to demonstrate tasks at his base line level, in standing and sitting as needed. Pt educated on importance of using the incentive spirometer to assist with lung functioning, as well as energy conservation and fall safety. He does not need OT services at this time and will be discharged from acute OT services.     Follow Up Recommendations  No OT follow up    Equipment Recommendations  None recommended by OT    Recommendations for Other Services       Precautions / Restrictions Precautions Precautions: Fall Precaution Comments: Low fall risk Restrictions Weight Bearing Restrictions: No      Mobility Bed Mobility Overal bed mobility: Independent             General bed mobility comments: Pt up in recliner on entry    Transfers Overall transfer level: Independent Equipment used: None Transfers: Sit to/from Stand Sit to Stand: Independent         General transfer comment: Pt able to transfer with no LoB from low surfaces with no assistance.    Balance Overall balance assessment: No apparent balance deficits (not formally assessed);Independent         Standing balance support: No upper extremity supported Standing  balance-Leahy Scale: Good Standing balance comment: Able to ambulate and maintain semi-tandem stance without UE support or LOB. However, only able to maintain tandem and SLS on either leg for 2-3 seconds at a time. Single Leg Stance - Right Leg: 3 (seconds no UE support) Single Leg Stance - Left Leg: 3 (seconds no UE support) Tandem Stance - Right Leg: 3 (seconds no UE support) Tandem Stance - Left Leg: 3 (seconds no UE support)         Standardized Balance Assessment Standardized Balance Assessment : Dynamic Gait Index   Dynamic Gait Index Level Surface: Normal Change in Gait Speed: Normal Gait with Horizontal Head Turns: Normal Gait with Vertical Head Turns: Normal Gait and Pivot Turn: Normal Step Over Obstacle: Normal Step Around Obstacles: Normal Steps: Mild Impairment (assumed, not tested) Total Score: 23     ADL either performed or assessed with clinical judgement   ADL Overall ADL's : Independent;At baseline                                       General ADL Comments: Pt able to dress, shower standing with no balance deficits, complete grooming  at the sink, and toileting with no difficulties and no noted LoB     Vision Baseline Vision/History: No visual deficits;Wears glasses Wears Glasses: Reading only Patient Visual Report: No change from baseline Additional Comments: Pt had cataract surgery, reports clear vision,  needing glasses only in darker lighting and small print.     Perception Perception Perception Tested?: No   Praxis Praxis Praxis tested?: Not tested    Pertinent Vitals/Pain Pain Assessment: 0-10 Pain Score: 10-Worst pain ever Pain Location: L chest Pain Descriptors / Indicators: Discomfort;Grimacing Pain Intervention(s): Monitored during session     Hand Dominance Right   Extremity/Trunk Assessment Upper Extremity Assessment Upper Extremity Assessment: Overall WFL for tasks assessed   Lower Extremity Assessment Lower  Extremity Assessment: Defer to PT evaluation   Cervical / Trunk Assessment Cervical / Trunk Assessment: Normal   Communication Communication Communication: No difficulties   Cognition Arousal/Alertness: Awake/alert Behavior During Therapy: WFL for tasks assessed/performed Overall Cognitive Status: Within Functional Limits for tasks assessed                                 General Comments: A&Ox4. Pt very pleasant and cooperative.   General Comments  VSS on RA    Exercises     Shoulder Instructions      Home Living Family/patient expects to be discharged to:: Private residence Living Arrangements: Alone   Type of Home: Other(Comment) (3rd floor hotel room) Home Access: Level entry     Home Layout: Multi-level (hotel)     Bathroom Shower/Tub: Chief Strategy Officer: Standard         Additional Comments: Lives in a hotel, uses a cab or walks for transportation.      Prior Functioning/Environment Level of Independence: Independent        Comments: Independent with all ADL's and IADL's        OT Problem List: Impaired balance (sitting and/or standing);Decreased coordination;Decreased safety awareness      OT Treatment/Interventions:      OT Goals(Current goals can be found in the care plan section) Acute Rehab OT Goals Patient Stated Goal: to return to his hotel room OT Goal Formulation: All assessment and education complete, DC therapy Time For Goal Achievement: 07/31/20  OT Frequency:     Barriers to D/C:            Co-evaluation              AM-PAC OT "6 Clicks" Daily Activity     Outcome Measure Help from another person eating meals?: None Help from another person taking care of personal grooming?: None Help from another person toileting, which includes using toliet, bedpan, or urinal?: None Help from another person bathing (including washing, rinsing, drying)?: None Help from another person to put on and taking  off regular upper body clothing?: None Help from another person to put on and taking off regular lower body clothing?: None 6 Click Score: 24   End of Session Equipment Utilized During Treatment: Gait belt Nurse Communication: Mobility status  Activity Tolerance: Patient tolerated treatment well Patient left: in chair;with call bell/phone within reach  OT Visit Diagnosis: Unsteadiness on feet (R26.81);Muscle weakness (generalized) (M62.81);History of falling (Z91.81)                Time: 1000-1029 OT Time Calculation (min): 29 min Charges:  OT General Charges $OT Visit: 1 Visit OT Evaluation $OT Eval Low Complexity: 1 Low OT Treatments $Self Care/Home Management : 8-22 mins  Matison Nuccio H., OTR/L Acute Rehabilitation  Marlyce Mcdougald Elane Gaynelle Pastrana 07/31/2020, 10:42 AM

## 2020-07-31 NOTE — Evaluation (Signed)
Physical Therapy Evaluation & Discharge  Patient Details Name: Stephen [name]. MRN: [id number] DOB: [birth year] Today's Date: 07/31/2020   History of Present Illness  Pt is a 75 y.o. male who presented 5/10 with L 4-5 rib fx with small left  anterior and basilar pneumothorax estimated at less than 10% secondary to a fall. No evidence of acute intracranial abnormality. PMH: HTN, GERD, DM, and anxiety.    Clinical Impression  Pt presents with condition above and deficits mentioned below, see PT Problem List. PTA, he was living in a hotel that has an elevator and utilizing a cab for transportation. He was independent with all functional mobility and ADL's. Currently, pt appears to be and reports he is at his baseline, ambulating safely without LOB. Pt does demonstrate some mild balance deficits when his feet are in a narrow stance, like tandem or single-leg stance, holding for only ~2-3 second durations with no UE support. He does not appear to be at risk for falls though, as is supported by his DGI score of 23 this date. Pt is unsure what caused his fall that resulting in his rib fxs, reporting he was walking across the street with his head tilted back to drink out of his water bottle when he fell. He denies dizziness and tested negative on bil [employer] tests. All education completed and questions answered. PT will sign off at this time. Thank you for this referral.    Follow Up Recommendations No PT follow up    Equipment Recommendations  None recommended by PT    Recommendations for Other Services       Precautions / Restrictions Precautions Precautions: Fall (low) Restrictions Weight Bearing Restrictions: No      Mobility  Bed Mobility Overal bed mobility: Independent             General bed mobility comments: Pt able to perform supine > sit transition with bed flat and no use of rails safely in appropriate time frame.    Transfers Overall transfer level: Needs  assistance Equipment used: None Transfers: Sit to/from Stand Sit to Stand: Supervision         General transfer comment: Pt able to transfer with supervision for safety and no LOB.  Ambulation/Gait Ambulation/Gait assistance: Supervision;Min guard Gait Distance (Feet): 180 Feet Assistive device: None Gait Pattern/deviations: WFL(Within Functional Limits) Gait velocity: WFL Gait velocity interpretation: >4.37 ft/sec, indicative of normal walking speed (when cued to increase speed) General Gait Details: Pt able to change head positions and gait speeds without LOB, demonstrating WFL gait pattern. min guard-supervision for safety.  Stairs            Wheelchair Mobility    Modified Rankin (Stroke Patients Only) Modified Rankin (Stroke Patients Only) Pre-Morbid Rankin Score: No symptoms Modified Rankin: No symptoms     Balance Overall balance assessment: Needs assistance         Standing balance support: No upper extremity supported Standing balance-Leahy Scale: Good Standing balance comment: Able to ambulate and maintain semi-tandem stance without UE support or LOB. However, only able to maintain tandem and SLS on either leg for 2-3 seconds at a time. Single Leg Stance - Right Leg: 3 (seconds no UE support) Single Leg Stance - Left Leg: 3 (seconds no UE support) Tandem Stance - Right Leg: 3 (seconds no UE support) Tandem Stance - Left Leg: 3 (seconds no UE support)         Standardized Balance Assessment Standardized Balance Assessment : Dynamic  Gait Index   Dynamic Gait Index Level Surface: Normal Change in Gait Speed: Normal Gait with Horizontal Head Turns: Normal Gait with Vertical Head Turns: Normal Gait and Pivot Turn: Normal Step Over Obstacle: Normal Step Around Obstacles: Normal Steps: Mild Impairment (assumed, not tested) Total Score: 23       Pertinent Vitals/Pain Pain Assessment: 0-10 Pain Score: 10-Worst pain ever Pain Location: L  chest Pain Descriptors / Indicators: Discomfort;Grimacing Pain Intervention(s): Limited activity within patient's tolerance;Monitored during session;Repositioned    Home Living Family/patient expects to be discharged to:: Private residence Living Arrangements: Alone   Type of Home: Other(Comment) (3rd-floor hotel room, there is an Engineer, structural) Home Access: Level entry     Home Layout: Multi-level (hotel)   Additional Comments: Lives in hotel, uses a cab for transportation    Prior Function Level of Independence: Independent         Comments: Pt uses a cab for transportation and is independent with all ADLs and mobility.     Hand Dominance   Dominant Hand: Right    Extremity/Trunk Assessment   Upper Extremity Assessment Upper Extremity Assessment: Defer to OT evaluation    Lower Extremity Assessment Lower Extremity Assessment: Overall WFL for tasks assessed (hx of peripheral neuropathy)    Cervical / Trunk Assessment Cervical / Trunk Assessment: Normal  Communication   Communication: No difficulties  Cognition Arousal/Alertness: Awake/alert Behavior During Therapy: WFL for tasks assessed/performed Overall Cognitive Status: Within Functional Limits for tasks assessed                                 General Comments: A&Ox4. Pt very pleasant and cooperative.      General Comments General comments (skin integrity, edema, etc.): Negative [employer] tests on either side, repeated several times; pt unsure of mechanism of fall that resulted in his rib fxs, reporting he had his head tilted up to drink his water while crossing a road; educated pt on HEP to perform tandem and SLS at stable counter to improve balance    Exercises     Assessment/Plan    PT Assessment Patent does not need any further PT services  PT Problem List         PT Treatment Interventions      PT Goals (Current goals can be found in the Care Plan section)  Acute Rehab PT  Goals Patient Stated Goal: to return to his hotel room PT Goal Formulation: With patient Time For Goal Achievement: 08/01/20 Potential to Achieve Goals: Good    Frequency     Barriers to discharge        Co-evaluation               AM-PAC PT "6 Clicks" Mobility  Outcome Measure Help needed turning from your back to your side while in a flat bed without using bedrails?: None Help needed moving from lying on your back to sitting on the side of a flat bed without using bedrails?: None Help needed moving to and from a bed to a chair (including a wheelchair)?: A Little Help needed standing up from a chair using your arms (e.g., wheelchair or bedside chair)?: A Little Help needed to walk in hospital room?: A Little Help needed climbing 3-5 steps with a railing? : A Little 6 Click Score: 20    End of Session   Activity Tolerance: Patient tolerated treatment well Patient left: in chair;with call bell/phone  within reach;Other (comment) (with OT in room) Nurse Communication: Mobility status PT Visit Diagnosis: Unsteadiness on feet (R26.81);Pain Pain - Right/Left: Left Pain - part of body:  (ribs/chest)    Time: 0940-1005 PT Time Calculation (min) (ACUTE ONLY): 25 min   Charges:   PT Evaluation $PT Eval Low Complexity: 1 Low PT Treatments $Gait Training: 8-22 mins        Raymond Gurney, PT, DPT Acute Rehabilitation Services  Pager: [phone] Office: [phone]   Stephen [name] 07/31/2020, 10:14 AM

## 2020-07-31 NOTE — Progress Notes (Addendum)
Inpatient Diabetes Program Recommendations  AACE/ADA: New Consensus Statement on Inpatient Glycemic Control (2015)  Target Ranges:  Prepandial:   less than 140 mg/dL      Peak postprandial:   less than 180 mg/dL (1-2 hours)      Critically ill patients:  140 - 180 mg/dL   Lab Results  Component Value Date   GLUCAP 182 (H) 07/31/2020   HGBA1C 10.5 (H) 07/31/2020    Review of Glycemic Control  Results for [name], Stephen D JR. "JIMMY" (MRN [id number]) as of 07/31/2020 13:11  Ref. Range 07/30/2020 20:05 07/30/2020 20:37 07/31/2020 08:01 07/31/2020 12:02  Glucose-Capillary Latest Ref Range: 70 - 99 mg/dL 903 (H) 009 (H) 233 (H) 182 (H)   Diabetes history: DM 2 Outpatient Diabetes medications: Xigduo 12/998 mg daily Current orders for Inpatient glycemic control:  Novolog moderate tid with meals and HS Novolog 4 units tid with meals Metformin 1000 mg bid Farxiga 10 mg daily  Inpatient Diabetes Program Recommendations:    Referral received.  Note that patient will be discharged home today.  Patient is COVID positive so called him by phone to discuss DM management.  A1C has increased from 8% to 10.5%.  Discussed with patient.  He states that he does check his blood sugars every morning and that they are usually >200 mg/dL.  We discussed glucose goals of 80-130 mg/dL fasting.  I instructed patient to continue to check blood sugars q AM and to let PCP know if >200 mg/dL.  Also discussed the importance of eliminating sugar from beverages and reducing CHO intake.  Patient drinks lots of juices, Gatorades, and sweet tea. Discussed that he needs to reduce and switch to drinking water.  Patient agreeable.  We also discussed reducing CHO intake with meals as well. Discussed importance of glycemic control to prevent complications and encouraged close f/u with PCP.  Patient verbalized understanding.  Will also place referral for outpatient DM education as well.   Thanks,  Beryl Meager, RN, BC-ADM Inpatient  Diabetes Coordinator Pager [phone] (8a-5p)

## 2020-07-31 NOTE — Social Work (Addendum)
CSW called pts daughter April at [phone]. CSW left HIPPA compliant voicemail.   CSW gave cab voucher to RN.   Jimmy Picket, Theresia Majors, [place] Clinical Social Worker [phone]

## 2020-07-31 NOTE — Progress Notes (Addendum)
[facility] Progress Note     Subjective: CC:  No complaints. Reports left chest wall pain that improves with medication. Tolerating PO. Denies urinary sxs. +flatus. Confirms that he lives in a hotel. His PCP is Dr. [name] and sees him every 3 months. Says he is compliant with home meds. Also states he has seen a cardiologist in the past who acknowledged the pericardial effusion and was not concerned about it.   Objective: Vital signs in last 24 hours: Temp:  [97.6 F (36.4 C)-98.4 F (36.9 C)] 97.6 F (36.4 C) (05/11 0857) Pulse Rate:  [59-94] 72 (05/11 0857) Resp:  [18] 18 (05/11 0500) BP: (101-169)/(61-93) 169/72 (05/11 0857) SpO2:  [92 %-100 %] 94 % (05/11 0857) Weight:  [77.3 kg-85.5 kg] 77.3 kg (05/10 2045)    Intake/Output from previous day: 05/10 0701 - 05/11 0700 In: 493.5 [P.O.:300; I.V.:193.5] Out: -  Intake/Output this shift: No intake/output data recorded.  PE: Gen:  Alert, NAD, pleasant and cooperative  Card:  Regular rate and rhythm, pedal pulses 2+ BL Pulm:  Normal effort, appropriately tender left anteriorlateral chest wall, clear to auscultation bilaterally Abd: Soft, non-tender, non-distended, bowel sounds present in all 4 quadrants, no HSM Skin: warm and dry, no rashes  Psych: A&Ox3   Lab Results:  Recent Labs    07/30/20 1435  WBC 10.7*  HGB 13.1  HCT 39.0  PLT 245   BMET Recent Labs    07/30/20 1435 07/31/20 0339  NA 138 136  K 3.2* 3.3*  CL 97* 98  CO2 27 28  GLUCOSE 423* 279*  BUN 20 19  CREATININE 0.93 0.94  CALCIUM 9.0 8.9   PT/INR No results for input(s): LABPROT, INR in the last 72 hours. CMP     Component Value Date/Time   NA 136 07/31/2020 0339   K 3.3 (L) 07/31/2020 0339   CL 98 07/31/2020 0339   CO2 28 07/31/2020 0339   GLUCOSE 279 (H) 07/31/2020 0339   BUN 19 07/31/2020 0339   CREATININE 0.94 07/31/2020 0339   CALCIUM 8.9 07/31/2020 0339   PROT 7.1 07/30/2020 1435   ALBUMIN 3.7 07/30/2020 1435   AST  22 07/30/2020 1435   ALT 16 07/30/2020 1435   ALKPHOS 77 07/30/2020 1435   BILITOT 0.5 07/30/2020 1435   GFRNONAA >60 07/31/2020 0339   Lipase  No results found for: LIPASE     Studies/Results: DG Ribs Unilateral W/Chest Left  Result Date: 07/30/2020 CLINICAL DATA:  Fall.  RIGHT-sided rib and chest pain. EXAM: LEFT RIBS AND CHEST - 3+ VIEW COMPARISON:  None. FINDINGS: The heart size is mildly enlarged. The thoracic aorta is tortuous. Superimposed over the aortic knob, there is a double density, raising the question of saccular aneurysm. The lungs are clear. Mediastinal contour is otherwise normal. Minimal atelectasis at the LEFT lung base. There is no pneumothorax. Oblique views of the ribs show acute fractures of the LEFT fourth and 5th ribs. IMPRESSION: Acute fractures of the LEFT fourth and 5th ribs. Abnormal aortic knob contour, raising question of aneurysm. Recommend further evaluation with CT of the chest. These results were called by telephone at the time of interpretation on 07/30/2020 at 2:19 pm to provider [facility] , who verbally acknowledged these results. Electronically Signed   By: Norva Pavlov M.D.   On: 07/30/2020 14:22   CT Head Wo Contrast  Result Date: 07/30/2020 CLINICAL DATA:  Head trauma, minor. Additional provided: Fall earlier today. EXAM: CT HEAD WITHOUT CONTRAST TECHNIQUE:  Contiguous axial images were obtained from the base of the skull through the vertex without intravenous contrast. COMPARISON:  No pertinent prior exams available for comparison. FINDINGS: Brain: Mild generalized parenchymal atrophy. Mild ill-defined hypoattenuation within the cerebral white matter is nonspecific, but compatible with chronic small vessel ischemic disease. There is no acute intracranial hemorrhage. No demarcated cortical infarct. No extra-axial fluid collection. No evidence of intracranial mass. No midline shift. Vascular: No hyperdense vessel.  Atherosclerotic calcifications.  Skull: Normal. Negative for fracture or focal lesion. Sinuses/Orbits: Visualized orbits show no acute finding. Mild bilateral ethmoid sinus mucosal thickening. IMPRESSION: No evidence of acute intracranial abnormality. Mild generalized parenchymal atrophy and cerebral white matter chronic small vessel ischemic disease. Mild bilateral ethmoid sinus mucosal thickening. Electronically Signed   By: Jackey Loge DO   On: 07/30/2020 14:58   CT Chest W Contrast  Result Date: 07/30/2020 CLINICAL DATA:  Left-sided chest pain difficulty breathing EXAM: CT CHEST WITH CONTRAST TECHNIQUE: Multidetector CT imaging of the chest was performed during intravenous contrast administration. CONTRAST:  32mL OMNIPAQUE IOHEXOL 300 MG/ML  SOLN COMPARISON:  Radiograph 07/30/2020 FINDINGS: Cardiovascular: Ectatic aorta without definitive aneurysm. Mild aortic atherosclerosis. Coronary vascular calcification. Mild cardiomegaly with small to moderate pericardial effusion, measuring up to 2 cm maximum thickness anteriorly. Prominent azygos vein draining into the superior vena cava. Hemi azygous and accessory hemi azygous veins with hemi azygous vein draining into the brachiocephalic vein on the left. This contributes to the opacity noted on radiography. Mediastinum/Nodes: Midline trachea. No suspicious thyroid mass. No suspicious nodes. Esophagus within normal limits Lungs/Pleura: No pleural effusion. Dependent atelectasis. Small left anterior and basilar pneumothorax, estimated at less than 10%. Questionable small foci of extrapleural air posteriorly on the right. Upper Abdomen: Right renal cyst.  No acute abnormality. Musculoskeletal: Sternum is intact. Thoracic vertebral bodies demonstrate normal stature. Acute mildly displaced left third, fourth, and fifth anterolateral rib fractures. Small amount of subcutaneous emphysema in left chest wall. Prominent gynecomastia. IMPRESSION: 1. Acute left third fourth and fifth rib fractures with  small left anterior and basilar pneumothorax estimated at less than 10%. Small amount of chest wall emphysema. 2. Questionable small foci of extrapleural air in the right posterior thorax though no definitive rib fracture. 3. Abnormal opacity on radiography appears to correspond to hemi azygous venous variant. There is no aortic aneurysm. Critical Value/emergent results were called by telephone at the time of interpretation on 07/30/2020 at 5:05 pm to provider [facility] , who verbally acknowledged these results. Aortic Atherosclerosis (ICD10-I70.0). Electronically Signed   By: Jasmine Pang M.D.   On: 07/30/2020 17:05   DG Chest Port 1 View  Result Date: 07/31/2020 CLINICAL DATA:  Pneumothorax. EXAM: PORTABLE CHEST 1 VIEW.  Patient is rotated. COMPARISON:  X-ray ribs with chest 07/30/2020, CT chest 07/30/2020 FINDINGS: The heart size and mediastinal contours are unchanged. No focal consolidation. No pulmonary edema. No pleural effusion. Difficulty visualizing known paramediastinal pneumothorax with question of trace residual peripheral pleural gas. Acute minimally displaced left rib fractures again noted and better appreciated on CT chest 07/30/2020. IMPRESSION: 1. Difficulty visualizing known paramediastinal pneumothorax with question of trace residual peripheral pleural gas. Also limited due to patient rotation. 2. Acute minimally displaced left rib fractures Electronically Signed   By: Tish Frederickson M.D.   On: 07/31/2020 06:03    Anti-infectives: Anti-infectives (From admission, onward)   None      Assessment/Plan Fall Left 4-5 rib fx with small ct only ptx- pain control, pulmonary toilet, repeat cxr  in am Pericardial effusion- doubt this has anything to do with injury, echo was ordered last night, rhythm normal on monitor; seeing cardiologist outpatient DM- ssi, home meds, follow glucose, check A1C, have diabetes coordinator to see HTN- home meds Hypokalemia- 3.3, give 40 mEq KCl BID  today.  COVID positive - CXR without PNA, oxygenating ORA, monitor, supportive care  FEN: carb mod ID: none currently VTE: SCD's, Lovenox Dispo: DM coordinator consult, CSW consult, ?echocardiogram -- will discuss with Dr. Bedelia Person if this is necessary, PT/OT recommending no follow up. Anticipate discharge within 24 hours -- possibly this afternoon.  Will need a cab ride home    LOS: 0 days    Hosie Spangle, [facility] Please see Amion for pager number during day hours 7:00am-4:30pm

## 2020-08-01 NOTE — Discharge Summary (Signed)
[facility] Discharge Summary   Patient ID: Stephen Gray. MRN: [id number] DOB/AGE: 1945/06/05 75 y.o.  Admit date: 07/30/2020 Discharge date: 07/31/2020  Discharge Diagnosis Patient Active Problem List   Diagnosis Date Noted  . Rib fractures 07/30/2020  . Iron deficiency anemia 04/17/2020  . Bradycardia 04/01/2020  . Deficiency anemia 03/20/2020  . Type 2 diabetes mellitus without complication, without long-term current use of insulin (HCC) 03/19/2020  . Hyperlipidemia with target LDL less than 100 03/19/2020  . Primary hypertension 03/19/2020  . Benign prostatic hyperplasia without lower urinary tract symptoms 03/19/2020  . Onychomycosis of right great toe 03/19/2020  . Subareolar gynecomastia in male 03/19/2020  . B12 deficiency due to diet 03/19/2020  . Cataract 04/23/2014    Consultants Diabetes coordinator   Imaging: CT Chest W Contrast  Result Date: 07/30/2020 CLINICAL DATA:  Left-sided chest pain difficulty breathing EXAM: CT CHEST WITH CONTRAST TECHNIQUE: Multidetector CT imaging of the chest was performed during intravenous contrast administration. CONTRAST:  16mL OMNIPAQUE IOHEXOL 300 MG/ML  SOLN COMPARISON:  Radiograph 07/30/2020 FINDINGS: Cardiovascular: Ectatic aorta without definitive aneurysm. Mild aortic atherosclerosis. Coronary vascular calcification. Mild cardiomegaly with small to moderate pericardial effusion, measuring up to 2 cm maximum thickness anteriorly. Prominent azygos vein draining into the superior vena cava. Hemi azygous and accessory hemi azygous veins with hemi azygous vein draining into the brachiocephalic vein on the left. This contributes to the opacity noted on radiography. Mediastinum/Nodes: Midline trachea. No suspicious thyroid mass. No suspicious nodes. Esophagus within normal limits Lungs/Pleura: No pleural effusion. Dependent atelectasis. Small left anterior and basilar pneumothorax, estimated at less than 10%. Questionable  small foci of extrapleural air posteriorly on the right. Upper Abdomen: Right renal cyst.  No acute abnormality. Musculoskeletal: Sternum is intact. Thoracic vertebral bodies demonstrate normal stature. Acute mildly displaced left third, fourth, and fifth anterolateral rib fractures. Small amount of subcutaneous emphysema in left chest wall. Prominent gynecomastia. IMPRESSION: 1. Acute left third fourth and fifth rib fractures with small left anterior and basilar pneumothorax estimated at less than 10%. Small amount of chest wall emphysema. 2. Questionable small foci of extrapleural air in the right posterior thorax though no definitive rib fracture. 3. Abnormal opacity on radiography appears to correspond to hemi azygous venous variant. There is no aortic aneurysm. Critical Value/emergent results were called by telephone at the time of interpretation on 07/30/2020 at 5:05 pm to provider [facility] , who verbally acknowledged these results. Aortic Atherosclerosis (ICD10-I70.0). Electronically Signed   By: Jasmine Pang M.D.   On: 07/30/2020 17:05   DG Chest Port 1 View  Result Date: 07/31/2020 CLINICAL DATA:  Pneumothorax. EXAM: PORTABLE CHEST 1 VIEW.  Patient is rotated. COMPARISON:  X-ray ribs with chest 07/30/2020, CT chest 07/30/2020 FINDINGS: The heart size and mediastinal contours are unchanged. No focal consolidation. No pulmonary edema. No pleural effusion. Difficulty visualizing known paramediastinal pneumothorax with question of trace residual peripheral pleural gas. Acute minimally displaced left rib fractures again noted and better appreciated on CT chest 07/30/2020. IMPRESSION: 1. Difficulty visualizing known paramediastinal pneumothorax with question of trace residual peripheral pleural gas. Also limited due to patient rotation. 2. Acute minimally displaced left rib fractures Electronically Signed   By: Tish Frederickson M.D.   On: 07/31/2020 06:03   HPI/Hospital course 75 year-old male with  dm, htn, hypercholesterolemia presents after fall when walking today.  Felt left chest pain. No loc.  Went back to his room and said pain would not go away, got worse  and was feeling sob and came to er.  No other complaints.  He underwent er evaluation and was found to have left rib fx with ct only pneumothorax and I was asked to see him. His glucose is also 423 on evaluation in the ED. Patient was admitted for pain control and observation. Follow up chest x-ray was negative for pneumothorax. Diabetes coordinator was consulted for diabetes education and management. Patient has seen a cardiologist in the past for cardiac eval including evaluation of pericardial effusion so repeat echocardiogram was no performed. On 07/31/20 vitals were stable, tolerating PO, cleared by physical therapy, and felt stable for discharge. Will follow up as below.  I have personally reviewed the patients medication history on the [place] controlled substance database.   Allergies as of 07/31/2020      Reactions   Shrimp Flavor Anaphylaxis, Hives, Shortness Of Breath, Itching, Nausea And Vomiting, Swelling, Rash      Medication List    TAKE these medications   Acetaminophen Extra Strength 500 MG tablet Generic drug: acetaminophen Take 2 tablets (1,000 mg total) by mouth every 6 (six) hours.   amLODipine 5 MG tablet Commonly known as: NORVASC Take 1 tablet (5 mg total) by mouth every evening.   docusate sodium 100 MG capsule Commonly known as: COLACE Take 1 capsule (100 mg total) by mouth 2 (two) times daily.   methocarbamol 750 MG tablet Commonly known as: ROBAXIN Take 1 tablet (750 mg total) by mouth every 8 (eight) hours as needed for muscle spasms.   MULTIVITAMIN ADULT PO Take 1 tablet by mouth daily.   Oxycodone HCl 10 MG Tabs Take 1 tablet (10 mg total) by mouth every 6 (six) hours as needed for up to 7 days (not relieved by tylenol, robaxin).   simvastatin 40 MG tablet Commonly known as: ZOCOR Take 1  tablet (40 mg total) by mouth at bedtime.   tamsulosin 0.4 MG Caps capsule Commonly known as: Flomax Take 1 capsule (0.4 mg total) by mouth daily after supper. What changed: when to take this   valsartan-hydrochlorothiazide 320-25 MG tablet Commonly known as: DIOVAN-HCT Take 1 tablet by mouth in the morning.   Vitamin D-3 25 MCG (1000 UT) Caps Take 1,000 Units by mouth daily.   Xigduo XR 12-998 MG Tb24 Generic drug: Dapagliflozin-metFORMIN HCl ER Take 1 tablet by mouth daily.         Follow-up Information    Etta Grandchild, MD. Schedule an appointment as soon as possible for a visit in 1 week(s).   Specialty: Internal Medicine Why: for follow up of high blood sugar/diabetes management.  Contact information: [address] [place] [address] [phone]        [facility] Follow up.   Why: call as needed if you develop questions or concerns regarding rib fractures.  Contact information: [address] [place] [address] [phone]              Signed: Hosie Spangle, [facility] 08/01/2020, 4:05 PM

## 2020-08-02 NOTE — Telephone Encounter (Signed)
Spoke to patient's sister regarding her question yesterday Dr Odis Hollingshead is aware of conversation.

## 2020-09-11 ENCOUNTER — Other Ambulatory Visit: Payer: Self-pay | Admitting: Internal Medicine

## 2020-09-11 DIAGNOSIS — N401 Enlarged prostate with lower urinary tract symptoms: Secondary | ICD-10-CM

## 2020-09-17 ENCOUNTER — Ambulatory Visit: Payer: Medicare (Managed Care) | Admitting: Internal Medicine

## 2020-09-25 DIAGNOSIS — R3915 Urgency of urination: Secondary | ICD-10-CM | POA: Diagnosis not present

## 2020-09-29 ENCOUNTER — Other Ambulatory Visit: Payer: Self-pay | Admitting: Internal Medicine

## 2020-09-29 DIAGNOSIS — E119 Type 2 diabetes mellitus without complications: Secondary | ICD-10-CM

## 2020-09-30 ENCOUNTER — Other Ambulatory Visit: Payer: Self-pay | Admitting: Internal Medicine

## 2020-09-30 DIAGNOSIS — E119 Type 2 diabetes mellitus without complications: Secondary | ICD-10-CM

## 2020-10-11 ENCOUNTER — Other Ambulatory Visit: Payer: Self-pay | Admitting: Internal Medicine

## 2020-10-11 DIAGNOSIS — E785 Hyperlipidemia, unspecified: Secondary | ICD-10-CM

## 2020-10-22 ENCOUNTER — Other Ambulatory Visit: Payer: Self-pay

## 2020-10-22 ENCOUNTER — Other Ambulatory Visit: Payer: Self-pay | Admitting: Internal Medicine

## 2020-10-22 ENCOUNTER — Encounter: Payer: Self-pay | Admitting: Internal Medicine

## 2020-10-22 ENCOUNTER — Ambulatory Visit (INDEPENDENT_AMBULATORY_CARE_PROVIDER_SITE_OTHER): Payer: Medicare Other | Admitting: Internal Medicine

## 2020-10-22 VITALS — BP 128/68 | HR 70 | Temp 98.0°F | Resp 16 | Ht 75.0 in | Wt 171.0 lb

## 2020-10-22 DIAGNOSIS — R001 Bradycardia, unspecified: Secondary | ICD-10-CM

## 2020-10-22 DIAGNOSIS — Z794 Long term (current) use of insulin: Secondary | ICD-10-CM

## 2020-10-22 DIAGNOSIS — I10 Essential (primary) hypertension: Secondary | ICD-10-CM | POA: Diagnosis not present

## 2020-10-22 DIAGNOSIS — I700 Atherosclerosis of aorta: Secondary | ICD-10-CM | POA: Diagnosis not present

## 2020-10-22 DIAGNOSIS — N5201 Erectile dysfunction due to arterial insufficiency: Secondary | ICD-10-CM

## 2020-10-22 DIAGNOSIS — E119 Type 2 diabetes mellitus without complications: Secondary | ICD-10-CM | POA: Diagnosis not present

## 2020-10-22 DIAGNOSIS — E291 Testicular hypofunction: Secondary | ICD-10-CM

## 2020-10-22 LAB — BASIC METABOLIC PANEL
BUN: 12 mg/dL (ref 6–23)
CO2: 30 mEq/L (ref 19–32)
Calcium: 9.4 mg/dL (ref 8.4–10.5)
Chloride: 99 mEq/L (ref 96–112)
Creatinine, Ser: 0.88 mg/dL (ref 0.40–1.50)
GFR: 84.15 mL/min (ref >60.00)
Glucose, Bld: 205 mg/dL — ABNORMAL HIGH (ref 70–99)
Potassium: 3.6 mEq/L (ref 3.5–5.1)
Sodium: 139 mEq/L (ref 135–145)

## 2020-10-22 LAB — MAGNESIUM: Magnesium: 1.9 mg/dL (ref 1.5–2.5)

## 2020-10-22 LAB — HEMOGLOBIN A1C: Hgb A1c MFr Bld: 8.9 % — ABNORMAL HIGH (ref 4.6–6.5)

## 2020-10-22 LAB — TSH: TSH: 0.92 u[IU]/mL (ref 0.35–5.50)

## 2020-10-22 NOTE — Patient Instructions (Signed)

## 2020-10-22 NOTE — Progress Notes (Signed)
70.0  Subjective:  Patient ID: Stephen Factor., male    DOB: 1945-05-29  Age: 75 y.o. MRN: [id number]  CC: Hypertension and Diabetes  This visit occurred during the SARS-CoV-2 public health emergency.  Safety protocols were in place, including screening questions prior to the visit, additional usage of staff PPE, and extensive cleaning of exam room while observing appropriate contact time as indicated for disinfecting solutions.    HPI Stephen Gray. presents for f/up -  He complains of a several week history of dizziness and lightheadedness.  He walks about 3 miles a day and does not experience chest pain, diaphoresis, edema, or fatigue.  He complains of erectile dysfunction and low libido - viagra has not been effective.  He tells me his blood sugars remain elevated at about 185-200.  He is taking the metformin/SGLT2 inhibitor combination.  Outpatient Medications Prior to Visit  Medication Sig Dispense Refill   acetaminophen (TYLENOL) 500 MG tablet Take 2 tablets (1,000 mg total) by mouth every 6 (six) hours. 30 tablet 0   Cholecalciferol (VITAMIN D-3) 25 MCG (1000 UT) CAPS Take 1,000 Units by mouth daily.     docusate sodium (COLACE) 100 MG capsule Take 1 capsule (100 mg total) by mouth 2 (two) times daily. 10 capsule 0   methocarbamol (ROBAXIN) 750 MG tablet Take 1 tablet (750 mg total) by mouth every 8 (eight) hours as needed for muscle spasms. 30 tablet 0   Multiple Vitamin (MULTIVITAMIN ADULT PO) Take 1 tablet by mouth daily.     simvastatin (ZOCOR) 40 MG tablet TAKE 1 TABLET(40 MG) BY MOUTH AT BEDTIME 90 tablet 1   tamsulosin (FLOMAX) 0.4 MG CAPS capsule TAKE 1 CAPSULE(0.4 MG) BY MOUTH DAILY AFTER AND SUPPER 90 capsule 1   XIGDUO XR 12-998 MG TB24 TAKE 1 TABLET BY MOUTH EVERY DAY 30 tablet 0   valsartan-hydrochlorothiazide (DIOVAN-HCT) 320-25 MG tablet Take 1 tablet by mouth in the morning. 90 tablet 1   amLODipine (NORVASC) 5 MG tablet Take 1 tablet (5 mg total) by mouth  every evening. 90 tablet 0   No facility-administered medications prior to visit.    ROS Review of Systems  Constitutional:  Negative for appetite change, diaphoresis, fatigue and unexpected weight change.  HENT: Negative.    Eyes: Negative.   Respiratory:  Negative for chest tightness, shortness of breath and wheezing.   Cardiovascular:  Negative for chest pain, palpitations and leg swelling.  Gastrointestinal:  Negative for abdominal pain, constipation, diarrhea, nausea and vomiting.  Endocrine: Negative.   Genitourinary: Negative.  Negative for difficulty urinating and dysuria.  Musculoskeletal: Negative.  Negative for arthralgias and myalgias.  Skin: Negative.   Neurological:  Positive for dizziness and light-headedness. Negative for weakness and numbness.  Hematological:  Negative for adenopathy. Does not bruise/bleed easily.  Psychiatric/Behavioral: Negative.     Objective:  BP 128/68 (BP Location: Left Arm, Patient Position: Sitting, Cuff Size: Large)   Pulse 70   Temp 98 F (36.7 C) (Oral)   Resp 16   Ht 6\' 3"  (1.905 m)   Wt 171 lb (77.6 kg)   SpO2 94%   BMI 21.37 kg/m   BP Readings from Last 3 Encounters:  10/22/20 128/68  07/31/20 (!) 149/61  05/01/20 118/63    Wt Readings from Last 3 Encounters:  10/22/20 171 lb (77.6 kg)  07/30/20 170 lb 8 oz (77.3 kg)  05/01/20 188 lb 9.6 oz (85.5 kg)    Physical Exam Vitals reviewed.  Constitutional:  Appearance: Normal appearance.  HENT:     Nose: Nose normal.     Mouth/Throat:     Mouth: Mucous membranes are moist.  Eyes:     Conjunctiva/sclera: Conjunctivae normal.  Cardiovascular:     Rate and Rhythm: Normal rate and regular rhythm.     Heart sounds: No murmur heard. Pulmonary:     Effort: Pulmonary effort is normal.     Breath sounds: No stridor. No wheezing, rhonchi or rales.  Abdominal:     General: Abdomen is flat.     Palpations: There is no mass.     Tenderness: There is no abdominal  tenderness. There is no guarding.     Hernia: No hernia is present.  Musculoskeletal:        General: Normal range of motion.     Cervical back: Neck supple.     Right lower leg: No edema.     Left lower leg: No edema.  Lymphadenopathy:     Cervical: No cervical adenopathy.  Skin:    General: Skin is warm and dry.  Neurological:     General: No focal deficit present.     Mental Status: He is alert.  Psychiatric:        Mood and Affect: Mood normal.        Behavior: Behavior normal.    Lab Results  Component Value Date   WBC 10.7 (H) 07/30/2020   HGB 13.1 07/30/2020   HCT 39.0 07/30/2020   PLT 245 07/30/2020   GLUCOSE 205 (H) 10/22/2020   CHOL 152 03/19/2020   TRIG 101.0 03/19/2020   HDL 62.50 03/19/2020   LDLCALC 69 03/19/2020   ALT 16 07/30/2020   AST 22 07/30/2020   NA 139 10/22/2020   K 3.6 10/22/2020   CL 99 10/22/2020   CREATININE 0.88 10/22/2020   BUN 12 10/22/2020   CO2 30 10/22/2020   TSH 0.92 10/22/2020   PSA 2.8 03/19/2020   HGBA1C 8.9 (H) 10/22/2020   MICROALBUR 2.5 (H) 03/19/2020    DG Ribs Unilateral W/Chest Left  Result Date: 07/30/2020 CLINICAL DATA:  Fall.  RIGHT-sided rib and chest pain. EXAM: LEFT RIBS AND CHEST - 3+ VIEW COMPARISON:  None. FINDINGS: The heart size is mildly enlarged. The thoracic aorta is tortuous. Superimposed over the aortic knob, there is a double density, raising the question of saccular aneurysm. The lungs are clear. Mediastinal contour is otherwise normal. Minimal atelectasis at the LEFT lung base. There is no pneumothorax. Oblique views of the ribs show acute fractures of the LEFT fourth and 5th ribs. IMPRESSION: Acute fractures of the LEFT fourth and 5th ribs. Abnormal aortic knob contour, raising question of aneurysm. Recommend further evaluation with CT of the chest. These results were called by telephone at the time of interpretation on 07/30/2020 at 2:19 pm to provider [facility] , who verbally acknowledged these  results. Electronically Signed   By: Norva Pavlov M.D.   On: 07/30/2020 14:22   CT Head Wo Contrast  Result Date: 07/30/2020 CLINICAL DATA:  Head trauma, minor. Additional provided: Fall earlier today. EXAM: CT HEAD WITHOUT CONTRAST TECHNIQUE: Contiguous axial images were obtained from the base of the skull through the vertex without intravenous contrast. COMPARISON:  No pertinent prior exams available for comparison. FINDINGS: Brain: Mild generalized parenchymal atrophy. Mild ill-defined hypoattenuation within the cerebral white matter is nonspecific, but compatible with chronic small vessel ischemic disease. There is no acute intracranial hemorrhage. No demarcated cortical infarct. No extra-axial fluid  collection. No evidence of intracranial mass. No midline shift. Vascular: No hyperdense vessel.  Atherosclerotic calcifications. Skull: Normal. Negative for fracture or focal lesion. Sinuses/Orbits: Visualized orbits show no acute finding. Mild bilateral ethmoid sinus mucosal thickening. IMPRESSION: No evidence of acute intracranial abnormality. Mild generalized parenchymal atrophy and cerebral white matter chronic small vessel ischemic disease. Mild bilateral ethmoid sinus mucosal thickening. Electronically Signed   By: Jackey Loge DO   On: 07/30/2020 14:58   CT Chest W Contrast  Result Date: 07/30/2020 CLINICAL DATA:  Left-sided chest pain difficulty breathing EXAM: CT CHEST WITH CONTRAST TECHNIQUE: Multidetector CT imaging of the chest was performed during intravenous contrast administration. CONTRAST:  30mL OMNIPAQUE IOHEXOL 300 MG/ML  SOLN COMPARISON:  Radiograph 07/30/2020 FINDINGS: Cardiovascular: Ectatic aorta without definitive aneurysm. Mild aortic atherosclerosis. Coronary vascular calcification. Mild cardiomegaly with small to moderate pericardial effusion, measuring up to 2 cm maximum thickness anteriorly. Prominent azygos vein draining into the superior vena cava. Hemi azygous and  accessory hemi azygous veins with hemi azygous vein draining into the brachiocephalic vein on the left. This contributes to the opacity noted on radiography. Mediastinum/Nodes: Midline trachea. No suspicious thyroid mass. No suspicious nodes. Esophagus within normal limits Lungs/Pleura: No pleural effusion. Dependent atelectasis. Small left anterior and basilar pneumothorax, estimated at less than 10%. Questionable small foci of extrapleural air posteriorly on the right. Upper Abdomen: Right renal cyst.  No acute abnormality. Musculoskeletal: Sternum is intact. Thoracic vertebral bodies demonstrate normal stature. Acute mildly displaced left third, fourth, and fifth anterolateral rib fractures. Small amount of subcutaneous emphysema in left chest wall. Prominent gynecomastia. IMPRESSION: 1. Acute left third fourth and fifth rib fractures with small left anterior and basilar pneumothorax estimated at less than 10%. Small amount of chest wall emphysema. 2. Questionable small foci of extrapleural air in the right posterior thorax though no definitive rib fracture. 3. Abnormal opacity on radiography appears to correspond to hemi azygous venous variant. There is no aortic aneurysm. Critical Value/emergent results were called by telephone at the time of interpretation on 07/30/2020 at 5:05 pm to provider [facility] , who verbally acknowledged these results. Aortic Atherosclerosis (ICD10-I70.0). Electronically Signed   By: Jasmine Pang M.D.   On: 07/30/2020 17:05   DG Chest Port 1 View  Result Date: 07/31/2020 CLINICAL DATA:  Pneumothorax. EXAM: PORTABLE CHEST 1 VIEW.  Patient is rotated. COMPARISON:  X-ray ribs with chest 07/30/2020, CT chest 07/30/2020 FINDINGS: The heart size and mediastinal contours are unchanged. No focal consolidation. No pulmonary edema. No pleural effusion. Difficulty visualizing known paramediastinal pneumothorax with question of trace residual peripheral pleural gas. Acute minimally  displaced left rib fractures again noted and better appreciated on CT chest 07/30/2020. IMPRESSION: 1. Difficulty visualizing known paramediastinal pneumothorax with question of trace residual peripheral pleural gas. Also limited due to patient rotation. 2. Acute minimally displaced left rib fractures Electronically Signed   By: Tish Frederickson M.D.   On: 07/31/2020 06:03    Assessment & Plan:   Drako was seen today for hypertension and diabetes.  Diagnoses and all orders for this visit:  Primary hypertension- His blood pressure is overcontrolled and he is symptomatic.  I recommended that he stop taking the thiazide diuretic and ARB. -     Basic metabolic panel; Future -     Magnesium; Future -     TSH; Future -     TSH -     Magnesium -     Basic metabolic panel  Type 2 diabetes mellitus  without complication, without long-term current use of insulin (HCC)-his A1c is better but still too high.  I recommended that he add basal insulin. -     Basic metabolic panel; Future -     Hemoglobin A1c; Future -     Hemoglobin A1c -     Basic metabolic panel  Bradycardia-he has no symptoms related to this. -     TSH; Future -     TSH  Atherosclerosis of aorta (HCC)- Risk factor modifications addressed.  Erectile dysfunction due to arterial insufficiency -     Testosterone Total,Free,Bio, Males; Future -     Testosterone Total,Free,Bio, Males  Insulin-requiring or dependent type II diabetes mellitus (HCC) -     insulin glargine, 2 Unit Dial, (TOUJEO MAX SOLOSTAR) 300 UNIT/ML Solostar Pen; Inject 20 Units into the skin daily. -     Continuous Blood Gluc Sensor (FREESTYLE LIBRE 2 SENSOR) MISC; 1 Act by Does not apply route daily. -     Continuous Blood Gluc Receiver (FREESTYLE LIBRE 2 READER) DEVI; 1 Act by Does not apply route daily. -     Amb Referral to Nutrition and Diabetic Education -     [facility] -     Insulin Pen Needle 32G X 6 MM MISC; 1 Act by Does not apply  route daily.  Hypogonadism male -     Testosterone 20.25 MG/1.25GM (1.62%) GEL; Place 2 Act onto the skin daily.  I have discontinued Quita SkyeJames D. Orpah GreekLangley Jr. "Jimmy"'s valsartan-hydrochlorothiazide. I am also having him start on PACCAR Incoujeo Max SoloStar, Eli Lilly and CompanyFreeStyle Libre 2 Sensor, [employer] ResourcesFreeStyle Libre 2 Reader, Insulin Pen Needle, and Testosterone. Additionally, I am having him maintain his Vitamin D-3, Multiple Vitamin (MULTIVITAMIN ADULT PO), amLODipine, acetaminophen, docusate sodium, methocarbamol, tamsulosin, Xigduo XR, and simvastatin.  Meds ordered this encounter  Medications   insulin glargine, 2 Unit Dial, (TOUJEO MAX SOLOSTAR) 300 UNIT/ML Solostar Pen    Sig: Inject 20 Units into the skin daily.    Dispense:  6 mL    Refill:  1   Continuous Blood Gluc Sensor (FREESTYLE LIBRE 2 SENSOR) MISC    Sig: 1 Act by Does not apply route daily.    Dispense:  2 each    Refill:  5   Continuous Blood Gluc Receiver (FREESTYLE LIBRE 2 READER) DEVI    Sig: 1 Act by Does not apply route daily.    Dispense:  2 each    Refill:  5   Insulin Pen Needle 32G X 6 MM MISC    Sig: 1 Act by Does not apply route daily.    Dispense:  100 each    Refill:  1   Testosterone 20.25 MG/1.25GM (1.62%) GEL    Sig: Place 2 Act onto the skin daily.    Dispense:  225 g    Refill:  1      Follow-up: Return in about 6 months (around 04/24/2021).  Sanda Lingerhomas Ommie Degeorge, MD

## 2020-10-23 ENCOUNTER — Telehealth: Payer: Self-pay | Admitting: Internal Medicine

## 2020-10-23 ENCOUNTER — Other Ambulatory Visit: Payer: Self-pay

## 2020-10-23 ENCOUNTER — Other Ambulatory Visit: Payer: Self-pay | Admitting: Internal Medicine

## 2020-10-23 DIAGNOSIS — Z794 Long term (current) use of insulin: Secondary | ICD-10-CM

## 2020-10-23 DIAGNOSIS — E291 Testicular hypofunction: Secondary | ICD-10-CM | POA: Insufficient documentation

## 2020-10-23 DIAGNOSIS — E119 Type 2 diabetes mellitus without complications: Secondary | ICD-10-CM | POA: Insufficient documentation

## 2020-10-23 DIAGNOSIS — E109 Type 1 diabetes mellitus without complications: Secondary | ICD-10-CM

## 2020-10-23 LAB — TESTOSTERONE TOTAL,FREE,BIO, MALES
Albumin: 4.4 g/dL (ref 3.6–5.1)
Sex Hormone Binding: 36 nmol/L (ref 22–77)
Testosterone: 108 ng/dL — ABNORMAL LOW (ref 250–827)

## 2020-10-23 MED ORDER — TESTOSTERONE 20.25 MG/1.25GM (1.62%) TD GEL: 40.5000 g | Freq: Every day | TRANSDERMAL | 1 refills | Status: DC

## 2020-10-23 MED ORDER — INSULIN PEN NEEDLE 32G X 6 MM MISC: 1.0000 | Freq: Every day | 1 refills | Status: AC

## 2020-10-23 MED ORDER — FREESTYLE LIBRE 2 SENSOR MISC: 1.0000 | Freq: Every day | 5 refills | Status: DC

## 2020-10-23 MED ORDER — FREESTYLE LIBRE 2 SENSOR MISC: 1.0000 "application " | Freq: Every day | 5 refills | Status: AC

## 2020-10-23 MED ORDER — TOUJEO MAX SOLOSTAR 300 UNIT/ML ~~LOC~~ SOPN: 20.0000 [IU] | PEN_INJECTOR | Freq: Every day | SUBCUTANEOUS | 1 refills | Status: DC

## 2020-10-23 MED ORDER — TESTOSTERONE 20.25 MG/1.25GM (1.62%) TD GEL: 2.0000 | Freq: Every day | TRANSDERMAL | 1 refills | Status: DC

## 2020-10-23 MED ORDER — FREESTYLE LIBRE 2 READER DEVI: 1.0000 | Freq: Every day | 5 refills | Status: DC

## 2020-10-23 MED ORDER — FREESTYLE LIBRE 2 READER DEVI: 1.0000 "application " | Freq: Every day | 5 refills | Status: DC

## 2020-10-23 NOTE — Progress Notes (Signed)
This encounter was created in error - please disregard.

## 2020-10-23 NOTE — Addendum Note (Signed)
Addended by: Aletta Edouard on: 10/23/2020 10:25 AM   Modules accepted: Orders, Level of Service, SmartSet

## 2020-10-23 NOTE — Telephone Encounter (Signed)
Pharmacy called in having trouble understanding directions on prescriptions for: Continuous Blood Gluc Sensor (FREESTYLE LIBRE 2 SENSOR) MISC  Testosterone 20.25 MG/1.25GM (1.62%) GEL   Please contact pharmacy w/ clear directions for both prescriptions  [phone]

## 2020-10-23 NOTE — Telephone Encounter (Signed)
Patient would like rx for Testosterone 20.25 MG/1.25GM (1.62%) GEL sent to different pharmacy...    Please send to:   [id number] [place], [place] - [address]  Phone:  [phone] Fax:  [phone]   Patient is also advising if there is a generic brand for the rx he is willing to accept if it is for a cheaper price  Please advise [phone] & send rx

## 2020-10-25 ENCOUNTER — Telehealth: Payer: Self-pay | Admitting: *Deleted

## 2020-10-25 NOTE — Chronic Care Management (AMB) (Signed)
  Chronic Care Management   Outreach Note  10/25/2020 Name: Stephen Gray. MRN: [id number] DOB: December 17, 1945  Stephen Turnbo. is a 75 y.o. year old male who is a primary care patient of Etta Grandchild, MD. I reached out to [name] by phone today in response to a referral sent by Mr. Stephen MACHNIK Jr.'s PCP, Dr. [name]      An unsuccessful telephone outreach was attempted today. The patient was referred to the case management team for assistance with care management and care coordination.   Follow Up Plan: A HIPAA compliant phone message was left for the patient providing contact information and requesting a return call. The care management team will reach out to the patient again over the next 7-14 days.  If patient returns call to provider office, please advise to call Embedded Care Management Care Guide Misty Stanley  at [phone].  Gwenevere Ghazi  Care Guide, Embedded Care Coordination [facility] Management  Direct Dial: [phone]

## 2020-10-28 ENCOUNTER — Ambulatory Visit (HOSPITAL_COMMUNITY)
Admission: RE | Admit: 2020-10-28 | Discharge: 2020-10-28 | Disposition: A | Discharge status: Home / Self Care | Payer: Medicare Other | Source: Ambulatory Visit | Attending: Cardiology | Admitting: Cardiology

## 2020-10-28 DIAGNOSIS — R3915 Urgency of urination: Secondary | ICD-10-CM | POA: Diagnosis not present

## 2020-10-28 DIAGNOSIS — E785 Hyperlipidemia, unspecified: Secondary | ICD-10-CM | POA: Insufficient documentation

## 2020-10-28 DIAGNOSIS — K219 Gastro-esophageal reflux disease without esophagitis: Secondary | ICD-10-CM | POA: Diagnosis not present

## 2020-10-28 DIAGNOSIS — E119 Type 2 diabetes mellitus without complications: Secondary | ICD-10-CM | POA: Diagnosis not present

## 2020-10-28 DIAGNOSIS — I700 Atherosclerosis of aorta: Secondary | ICD-10-CM | POA: Insufficient documentation

## 2020-10-28 DIAGNOSIS — I313 Pericardial effusion (noninflammatory): Secondary | ICD-10-CM | POA: Insufficient documentation

## 2020-10-28 DIAGNOSIS — I10 Essential (primary) hypertension: Secondary | ICD-10-CM | POA: Diagnosis not present

## 2020-10-28 DIAGNOSIS — R42 Dizziness and giddiness: Secondary | ICD-10-CM | POA: Diagnosis not present

## 2020-10-28 DIAGNOSIS — I3139 Other pericardial effusion (noninflammatory): Secondary | ICD-10-CM

## 2020-10-28 NOTE — Progress Notes (Signed)
  Echocardiogram 2D Echocardiogram has been performed. Dr. Odis Hollingshead contacted to read echo at 12:46.  Leta Jungling M 10/28/2020, 12:45 PM

## 2020-10-29 DIAGNOSIS — I313 Pericardial effusion (noninflammatory): Secondary | ICD-10-CM | POA: Diagnosis not present

## 2020-10-29 DIAGNOSIS — I10 Essential (primary) hypertension: Secondary | ICD-10-CM | POA: Diagnosis not present

## 2020-10-29 LAB — ECHOCARDIOGRAM COMPLETE
Area-P 1/2: 2.38 cm2
S' Lateral: 2.6 cm

## 2020-10-30 ENCOUNTER — Ambulatory Visit: Payer: Medicare Other | Admitting: Cardiology

## 2020-10-30 DIAGNOSIS — E119 Type 2 diabetes mellitus without complications: Secondary | ICD-10-CM

## 2020-10-30 DIAGNOSIS — R001 Bradycardia, unspecified: Secondary | ICD-10-CM

## 2020-10-30 DIAGNOSIS — I10 Essential (primary) hypertension: Secondary | ICD-10-CM

## 2020-10-30 DIAGNOSIS — I313 Pericardial effusion (noninflammatory): Secondary | ICD-10-CM

## 2020-10-30 DIAGNOSIS — I471 Supraventricular tachycardia: Secondary | ICD-10-CM

## 2020-10-31 ENCOUNTER — Telehealth: Payer: Self-pay | Admitting: Internal Medicine

## 2020-10-31 NOTE — Telephone Encounter (Signed)
Left message for patient to call me back at [phone] to schedule Medicare Annual Wellness Visit   No hx of AWV eligible as of 03/23/09  Please schedule at anytime with [facility] Health Advisor if patient calls the office back.    45 Minutes appointment   Any questions, please call me at [phone]  

## 2020-11-02 ENCOUNTER — Other Ambulatory Visit: Payer: Self-pay | Admitting: Internal Medicine

## 2020-11-02 DIAGNOSIS — E119 Type 2 diabetes mellitus without complications: Secondary | ICD-10-CM

## 2020-11-06 NOTE — Chronic Care Management (AMB) (Signed)
  Chronic Care Management   Outreach Note  11/06/2020 Name: Stephen Gray. MRN: [id number] DOB: 1945/05/18  [name]. is a 75 y.o. year old male who is a primary care patient of Etta Grandchild, MD. I reached out to [name] by phone today in response to a referral sent by Mr. [name]'s PCP, Dr. [name].      A second unsuccessful telephone outreach was attempted today. The patient was referred to the case management team for assistance with care management and care coordination.   Follow Up Plan: A HIPAA compliant phone message was left for the patient providing contact information and requesting a return call. The care management team will reach out to the patient again over the next 7 days.  If patient returns call to provider office, please advise to call Embedded Care Management Care Guide Misty Stanley at [phone].  Gwenevere Ghazi  Care Guide, Embedded Care Coordination [facility] Management  Direct Dial: [phone]

## 2020-11-08 NOTE — Chronic Care Management (AMB) (Signed)
  Chronic Care Management   Outreach Note  11/08/2020 Name: Aj Crunkleton. MRN: [id number] DOB: [birth year]  Kolt Mcwhirter. is a 75 y.o. year old male who is a primary care patient of Etta Grandchild, MD. I reached out to [name] by phone today in response to a referral sent by Mr. GERRARD CRYSTAL Jr.'s PCP, Dr. [name].      Third unsuccessful telephone outreach was attempted today. The patient was referred to the case management team for assistance with care management and care coordination. The patient's primary care provider has been notified of our unsuccessful attempts to make or maintain contact with the patient. The care management team is pleased to engage with this patient at any time in the future should he/she be interested in assistance from the care management team.   Follow Up Plan: The care management team is available to follow up with the patient after provider conversation with the patient regarding recommendation for care management engagement and subsequent re-referral to the care management team. A HIPAA compliant phone message was left for the patient providing contact information and requesting a return call.   Gwenevere Ghazi  Care Guide, Embedded Care Coordination [facility] Management  Direct Dial: [phone]

## 2020-11-12 ENCOUNTER — Other Ambulatory Visit: Payer: Self-pay

## 2020-11-12 ENCOUNTER — Encounter: Payer: Medicare Other | Attending: Internal Medicine | Admitting: Nutrition

## 2020-11-12 DIAGNOSIS — E119 Type 2 diabetes mellitus without complications: Secondary | ICD-10-CM | POA: Diagnosis not present

## 2020-11-12 DIAGNOSIS — Z794 Long term (current) use of insulin: Secondary | ICD-10-CM | POA: Insufficient documentation

## 2020-11-13 NOTE — Progress Notes (Signed)
Patient is here to discuss his diet.  He lives alone and eats out at least once a day with fast food or [employer] food choices. Has family in town, but not speaking to daughter at this time. Pt. Is here with his breakfast in his bag of sweetened cinnamon roll and regular soda to drink.   Medication; 20u of Toujeo qHS SBGM: He has his Freestyle Libre in had to learn how to use.  Does not like testing his blood sugar and thinks he will like this.  His phone is not capable of supporting the app for the readings to go to.  He was told that he will need the doctor to order a reader for him, and to call the office to let them know this.  He agreed to do this, and I told him that I will help him set it up with date and time and review again how to load, apply, and start the sensor,  Discussed the importance of diet with blood sugar control, and the need to stop all sweet drinks and stop eating sweetened cereal and breakfast rolls.  List of other breakfast choices given to him as well as other drinks that he can drink that will not raise blood sugar 200 points.  He agreed to contact the doctor's office and return with the reader for further training.

## 2020-11-21 ENCOUNTER — Other Ambulatory Visit: Payer: Self-pay | Admitting: Internal Medicine

## 2020-11-21 DIAGNOSIS — E119 Type 2 diabetes mellitus without complications: Secondary | ICD-10-CM

## 2020-11-21 MED ORDER — FREESTYLE LIBRE 2 READER DEVI
1.0000 | Freq: Every day | 5 refills | Status: AC
Start: 2020-11-21 — End: ?

## 2020-11-21 NOTE — Patient Instructions (Signed)
Stop all sweet drinks, teas, lemonade and gatorade.  Switch to 0 calorie drinks like flavored waters. Stop all cold cereals and milk.  Switch to 2 pieces of peanut butter toast with small amount of jelly, or melted cheese But frozen chicken packs and cook them for dinner with 2 vegetables and 1 dinner roll Stop all sweetened rolls for breakfast.  Return for training of Freestyle Libre, when you get the reader, or call the 800 help line for training on how to set the reader up and apply the sensor.

## 2020-11-26 NOTE — Progress Notes (Signed)
Called pt no answer, left a vm to return the call back

## 2020-12-03 NOTE — Progress Notes (Signed)
Please follow up with patient about scheduling FU appointment.

## 2020-12-10 ENCOUNTER — Other Ambulatory Visit: Payer: Self-pay | Admitting: Internal Medicine

## 2020-12-10 DIAGNOSIS — I10 Essential (primary) hypertension: Secondary | ICD-10-CM

## 2021-01-13 DIAGNOSIS — Z0000 Encounter for general adult medical examination without abnormal findings: Secondary | ICD-10-CM | POA: Diagnosis not present

## 2021-01-13 DIAGNOSIS — E559 Vitamin D deficiency, unspecified: Secondary | ICD-10-CM | POA: Diagnosis not present

## 2021-01-13 DIAGNOSIS — E1169 Type 2 diabetes mellitus with other specified complication: Secondary | ICD-10-CM | POA: Diagnosis not present

## 2021-01-13 DIAGNOSIS — Z79899 Other long term (current) drug therapy: Secondary | ICD-10-CM | POA: Diagnosis not present

## 2021-01-13 DIAGNOSIS — Z23 Encounter for immunization: Secondary | ICD-10-CM | POA: Diagnosis not present

## 2021-01-13 DIAGNOSIS — Z7189 Other specified counseling: Secondary | ICD-10-CM | POA: Diagnosis not present

## 2021-01-13 DIAGNOSIS — Z0001 Encounter for general adult medical examination with abnormal findings: Secondary | ICD-10-CM | POA: Diagnosis not present

## 2021-01-13 DIAGNOSIS — Z1159 Encounter for screening for other viral diseases: Secondary | ICD-10-CM | POA: Diagnosis not present

## 2021-01-13 DIAGNOSIS — Z008 Encounter for other general examination: Secondary | ICD-10-CM | POA: Diagnosis not present

## 2021-01-13 DIAGNOSIS — R300 Dysuria: Secondary | ICD-10-CM | POA: Diagnosis not present

## 2021-01-13 DIAGNOSIS — I110 Hypertensive heart disease with heart failure: Secondary | ICD-10-CM | POA: Diagnosis not present

## 2021-01-15 ENCOUNTER — Encounter: Payer: Self-pay | Admitting: Cardiology

## 2021-01-15 ENCOUNTER — Other Ambulatory Visit: Payer: Self-pay

## 2021-01-15 ENCOUNTER — Ambulatory Visit: Payer: Medicare Other | Admitting: Cardiology

## 2021-01-15 VITALS — BP 135/67 | HR 86 | Resp 16 | Ht 75.0 in | Wt 187.0 lb

## 2021-01-15 DIAGNOSIS — I471 Supraventricular tachycardia: Secondary | ICD-10-CM

## 2021-01-15 DIAGNOSIS — E1165 Type 2 diabetes mellitus with hyperglycemia: Secondary | ICD-10-CM | POA: Diagnosis not present

## 2021-01-15 DIAGNOSIS — E782 Mixed hyperlipidemia: Secondary | ICD-10-CM

## 2021-01-15 DIAGNOSIS — Z794 Long term (current) use of insulin: Secondary | ICD-10-CM | POA: Diagnosis not present

## 2021-01-15 DIAGNOSIS — R001 Bradycardia, unspecified: Secondary | ICD-10-CM

## 2021-01-15 DIAGNOSIS — I3139 Other pericardial effusion (noninflammatory): Secondary | ICD-10-CM

## 2021-01-15 DIAGNOSIS — I10 Essential (primary) hypertension: Secondary | ICD-10-CM | POA: Diagnosis not present

## 2021-01-15 MED ORDER — METOPROLOL SUCCINATE ER 25 MG PO TB24: 25.0000 mg | ORAL_TABLET | Freq: Every day | ORAL | 0 refills | Status: DC

## 2021-01-15 NOTE — Progress Notes (Signed)
ID:  Stephen Gray., DOB [birth year], MRN [id number]  PCP:  Etta Grandchild, MD  Cardiologist:  Tessa Lerner, DO, [facility] (established care 04/01/2020)  Date: 01/15/21 Last Office Visit: 05/01/2020   Chief Complaint  Patient presents with   Follow-up    Management of pericardial effusion.  Discuss test results.    HPI  Stephen Gray is a 75 y.o. male who presents to the office with a chief complaint of " follow-up for pericardial effusion and review test results." Patient's past medical history and cardiovascular risk factors include: Pericardial effusion, hypertension, Hyperlipidemia, IDDM, Vitamin B12 deficiency, former smoker, advanced age.  He is referred to the office at the request of Etta Grandchild, MD for evaluation of bradycardia.  Patient is accompanied by his daughter April Karp at today's office visit.  Patient provides verbal consent in regards to having her present during today's encounter.  In December 2021 patient returned to [place] to [place] to be closer to family and kids and was referred to the practice by his PCP given his chronic comorbid conditions and evaluation of bradycardia.  He underwent a GXT which noted preserved chronotropic competence, stress ECG was negative for ischemia, but did have brief episodes of supraventricular tachycardia likely to be atrial tach.  Echocardiogram noted preserved LVEF without any significant valvular heart disease but was noted to have moderate pericardial effusion that was not hemodynamically significant.  Patient was recommended to have a repeat echocardiogram in 6 months to reevaluate the progression of pericardial effusion and to evaluate its hemodynamics.  He had an echocardiogram in August 2022 but failed to follow-up thereafter until today.  Echocardiogram results reviewed which noted moderate to large pericardial effusion without tamponade physiology.  Clinically patient denies any chest pain or shortness  of breath at rest or with effort related activities.   FUNCTIONAL STATUS: Active for his age with ADLs and doing daily chores.  However, no structured exercise program or daily routine.  ALLERGIES: Allergies  Allergen Reactions   Shrimp Flavor Anaphylaxis, Hives, Shortness Of Breath, Itching, Nausea And Vomiting, Swelling and Rash    MEDICATION LIST PRIOR TO VISIT: Current Meds  Medication Sig   acetaminophen (TYLENOL) 500 MG tablet Take 2 tablets (1,000 mg total) by mouth every 6 (six) hours.   amLODipine (NORVASC) 5 MG tablet TAKE 1 TABLET(5 MG) BY MOUTH DAILY   Cholecalciferol (VITAMIN D-3) 25 MCG (1000 UT) CAPS Take 1,000 Units by mouth daily.   Continuous Blood Gluc Receiver (FREESTYLE LIBRE 2 READER) DEVI Apply 1 application topically daily.   Continuous Blood Gluc Sensor (FREESTYLE LIBRE 2 SENSOR) MISC Apply 1 application topically daily.   insulin glargine, 2 Unit Dial, (TOUJEO MAX SOLOSTAR) 300 UNIT/ML Solostar Pen Inject 20 Units into the skin daily.   Insulin Pen Needle 32G X 6 MM MISC 1 Act by Does not apply route daily.   metoprolol succinate (TOPROL XL) 25 MG 24 hr tablet Take 1 tablet (25 mg total) by mouth daily. Hold if systolic blood pressure (top number) less than 100 mmHg or pulse less than 60 bpm.   Multiple Vitamin (MULTIVITAMIN ADULT PO) Take 1 tablet by mouth daily.   simvastatin (ZOCOR) 40 MG tablet TAKE 1 TABLET(40 MG) BY MOUTH AT BEDTIME   tamsulosin (FLOMAX) 0.4 MG CAPS capsule TAKE 1 CAPSULE(0.4 MG) BY MOUTH DAILY AFTER AND SUPPER   XIGDUO XR 12-998 MG TB24 TAKE 1 TABLET BY MOUTH EVERY DAY     PAST MEDICAL HISTORY:  Past Medical History:  Diagnosis Date   Anxiety    Diabetes (HCC)    GERD (gastroesophageal reflux disease)    Hypercholesteremia    Hypertension     PAST SURGICAL HISTORY: Past Surgical History:  Procedure Laterality Date   TESTICULAR EXPLORATION      FAMILY HISTORY: The patient family history includes Diabetes in his mother;  Kidney disease in his father.  SOCIAL HISTORY:  The patient  reports that he has quit smoking. He has never used smokeless tobacco. He reports that he does not currently use alcohol. He reports that he does not currently use drugs.  REVIEW OF SYSTEMS: Review of Systems  Constitutional: Negative for chills and fever.  HENT:  Positive for hearing loss (decreased hearing, per patient. ). Negative for hoarse voice and nosebleeds.   Eyes:  Negative for blurred vision, discharge, double vision and pain.  Cardiovascular:  Negative for chest pain, claudication, dyspnea on exertion, leg swelling, near-syncope, orthopnea, palpitations, paroxysmal nocturnal dyspnea and syncope.  Respiratory:  Negative for hemoptysis and shortness of breath.   Musculoskeletal:  Negative for muscle cramps and myalgias.  Gastrointestinal:  Negative for abdominal pain, constipation, diarrhea, hematemesis, hematochezia, melena, nausea and vomiting.  Neurological:  Negative for dizziness and light-headedness.   PHYSICAL EXAM: Vitals with BMI 01/15/2021 01/15/2021 10/22/2020  Height - 6\' 3"  6\' 3"   Weight - 187 lbs 171 lbs  BMI - 23.37 21.37  Systolic 135 159  Diastolic 67 96 68  Pulse 86 73 70   CONSTITUTIONAL: Well-developed and well-nourished. No acute distress.  SKIN: Skin is warm and dry. No rash noted. No cyanosis. No pallor. No jaundice HEAD: Normocephalic and atraumatic.  EYES: No scleral icterus MOUTH/THROAT: Moist oral membranes.   NECK: No JVD present. No thyromegaly noted. No carotid bruits  LYMPHATIC: No visible cervical adenopathy.  CHEST Normal respiratory effort. No intercostal retractions  LUNGS: Clear to auscultation bilaterally. No stridor. No wheezes. No rales.  CARDIOVASCULAR: Regular rate and rhythm, positive S1-S2, no murmurs rubs or gallops appreciated. ABDOMINAL: No apparent ascites.  EXTREMITIES: No peripheral edema  HEMATOLOGIC: No significant bruising NEUROLOGIC: Oriented to person,  place, and time. Nonfocal. Normal muscle tone.  PSYCHIATRIC: Normal mood and affect. Normal behavior. Cooperative  CARDIAC DATABASE: EKG: 01/15/2021: Normal sinus rhythm, 60 bpm, normal axis, nonspecific T wave abnormality.  Without underlying injury pattern.  Echocardiogram: 04/11/2020: Normal LV systolic function with visual EF 60-65%. Left ventricle cavity is normal in size. Mild left ventricular hypertrophy. Normal global wall motion. Normal diastolic filling pattern, normal LAP.  Mild tricuspid regurgitation. Moderate pericardial effusion. There is no hemodynamic significance. No prior study for comparison.  10/28/2020:  1. Left ventricular ejection fraction, by estimation, is 60 to 65%. The  left ventricle has normal function. The left ventricle has no regional wall motion abnormalities. There is mild left ventricular hypertrophy.  Left ventricular diastolic parameters  were normal.   2. Right ventricular systolic function is low normal. The right ventricular size is normal. Mildly increased right ventricular wall thickness. There is normal pulmonary artery systolic pressure.   3. Moderate to large pericardial effusion. The pericardial effusion is circumferential - mostly anterior to the right ventricle. There is no evidence of cardiac tamponade.   4. The mitral valve is grossly normal. No evidence of mitral valve regurgitation. No evidence of mitral stenosis.   5. The aortic valve is tricuspid. Aortic valve regurgitation is not visualized. Mild aortic valve sclerosis is present, with no evidence of aortic valve  stenosis.   6. There is mild (Grade II) plaque involving the descending aorta.   7. The inferior vena cava is normal in size with greater than 50% respiratory variability, suggesting right atrial pressure of 3 mmHg.   Comparison(s): Compared to outside echo 04/11/2020 no significant change.   Stress Testing: Treadmill Exercise Stress 04/12/2020: The patient exercised for 5  minutes and 59 seconds on Bruce protocol; achieved 4.76 METs at 119% of maximum predicted heart rate. Negative for ischemia. Chest pain is not present.  The heart rate response was accelerated. The baseline blood pressure was 144/84 mmHg and increased to 234/88 mmHg at peak exercise, which is a hypertensive response to exercise.  Resting ECG demonstrated normal sinus rhythm. Observed left ventricular hypertrophy with secondary ST-T changes, repolarization abnormality.  Peak ECG demonstrated no significant ST-T change from baseline abnormality. During exercise the peak ECG revealed nonsustained supraventricular (Atrial) tachycardia and rare premature ventricular contractions. Nonsustained supraventricular tachycardia and occasional premature ventricular contractions noted on ECG at stress.  The heart rate response was accelerated. The blood pressure response was hypertensive.  Heart Catheterization: None  LABORATORY DATA: CBC Latest Ref Rng & Units 07/30/2020 03/19/2020  WBC 4.0 - 10.5 K/uL 10.7(H) 8.0  Hemoglobin 13.0 - 17.0 g/dL 71.6 12.3(L)  Hematocrit 39.0 - 52.0 % 39.0 36.1(L)  Platelets 150 - 400 K/uL 245 258.0    CMP Latest Ref Rng & Units 10/22/2020 07/31/2020 07/30/2020  Glucose 70 - 99 mg/dL 967(E) 938(B) 017(P)  BUN 6 - 23 mg/dL 12 19 20   Creatinine 0.40 - 1.50 mg/dL 1.02 5.85  Sodium 135 - 145 mEq/L 139 136 138  Potassium 3.5 - 5.1 mEq/L 3.6 3.3(L) 3.2(L)  Chloride 96 - 112 mEq/L 99 98 97(L)  CO2 19 - 32 mEq/L 30 28 27   Calcium 8.4 - 10.5 mg/dL 9.4 8.9 9.0  Total Protein 6.5 - 8.1 g/dL - - 7.1  Total Bilirubin 0.3 - 1.2 mg/dL - - 0.5  Alkaline Phos 38 - 126 U/L - - 77  AST 15 - 41 U/L - - 22  ALT 0 - 44 U/L - - 16    Lipid Panel     Component Value Date/Time   CHOL 152 03/19/2020 1525   TRIG 101.0 03/19/2020 1525   HDL 62.50 03/19/2020 1525   CHOLHDL 2 03/19/2020 1525   VLDL 20.2 03/19/2020 1525   LDLCALC 69 03/19/2020 1525    No components found for:  NTPROBNP No results for input(s): PROBNP in the last 8760 hours. Recent Labs    03/19/20 1525 10/22/20 1355  TSH 1.43 0.92    BMP Recent Labs    07/30/20 1435 07/31/20 0339 10/22/20 1355  NA 138 136 139  K 3.2* 3.3* 3.6  CL 97* 98 99  CO2 27 28 30   GLUCOSE 423* 279* 205*  BUN 20 19 12   CREATININE 0.93 0.94 0.88  CALCIUM 9.0 8.9 9.4  GFRNONAA >60 >60  --     HEMOGLOBIN A1C Lab Results  Component Value Date   HGBA1C 8.9 (H) 10/22/2020   MPG 254.65 07/31/2020    IMPRESSION:    ICD-10-CM   1. Pericardial effusion  I31.39 ECHOCARDIOGRAM COMPLETE    2. Benign hypertension  I10 metoprolol succinate (TOPROL XL) 25 MG 24 hr tablet    3. Bradycardia  R00.1 EKG 12-Lead    4. Atrial tachycardia (HCC)  I47.1 metoprolol succinate (TOPROL XL) 25 MG 24 hr tablet    5. Type 2 diabetes mellitus with hyperglycemia,  with long-term current use of insulin (HCC)  E11.65    Z79.4     6. Mixed hyperlipidemia  E78.2        RECOMMENDATIONS: Shanna Rugar. is a 75 y.o. male whose past medical history and cardiac risk factors include: Pericardial effusion, hypertension, Hyperlipidemia, IDDM, Vitamin B12 deficiency, former smoker, advanced age.  Pericardial effusion Repeat echocardiogram in August 2022 notes moderate to large pericardial effusion without tamponade physiology. Hemodynamically patient remained stable. No indication for pericardiocentesis. No identifiable reversible cause. Patient is asked to follow-up with PCP to have age-appropriate cancer screening and screening for rheumatological disease. Repeat echocardiogram in 1 year to reevaluation or sooner if change in condition  Bradycardia Asymptomatic. Transient. Resolved for now. No additional work-up warranted at this time.  Benign hypertension Office blood pressures are well controlled. Medications reconciled.   Reemphasized importance of low-salt diet.    Atrial tachycardia (HCC) Start Toprol-XL 25 mg  p.o. daily.   Medication profile discussed with the patient at today's visit. Monitor for now  Type 2 diabetes mellitus with hyperglycemia, with long-term current use of insulin (HCC) Since last visit patient has been transitioned to insulin therapy. Recommend initiation of either ACE inhibitor's or ARB. Currently on statin therapy. Currently on Farxiga. Currently managed by primary care provider.  Mixed Hyperlipidemia:  Continue statin therapy.   Follow lipids. Currently managed by primary care provider. Patient denies myalgia or other side effects.   FINAL MEDICATION LIST END OF ENCOUNTER:  Current Outpatient Medications:    acetaminophen (TYLENOL) 500 MG tablet, Take 2 tablets (1,000 mg total) by mouth every 6 (six) hours., Disp: 30 tablet, Rfl: 0   amLODipine (NORVASC) 5 MG tablet, TAKE 1 TABLET(5 MG) BY MOUTH DAILY, Disp: 90 tablet, Rfl: 0   Cholecalciferol (VITAMIN D-3) 25 MCG (1000 UT) CAPS, Take 1,000 Units by mouth daily., Disp: , Rfl:    Continuous Blood Gluc Receiver (FREESTYLE LIBRE 2 READER) DEVI, Apply 1 application topically daily., Disp: 2 each, Rfl: 5   Continuous Blood Gluc Sensor (FREESTYLE LIBRE 2 SENSOR) MISC, Apply 1 application topically daily., Disp: 2 each, Rfl: 5   insulin glargine, 2 Unit Dial, (TOUJEO MAX SOLOSTAR) 300 UNIT/ML Solostar Pen, Inject 20 Units into the skin daily., Disp: 6 mL, Rfl: 1   Insulin Pen Needle 32G X 6 MM MISC, 1 Act by Does not apply route daily., Disp: 100 each, Rfl: 1   metoprolol succinate (TOPROL XL) 25 MG 24 hr tablet, Take 1 tablet (25 mg total) by mouth daily. Hold if systolic blood pressure (top number) less than 100 mmHg or pulse less than 60 bpm., Disp: 90 tablet, Rfl: 0   Multiple Vitamin (MULTIVITAMIN ADULT PO), Take 1 tablet by mouth daily., Disp: , Rfl:    simvastatin (ZOCOR) 40 MG tablet, TAKE 1 TABLET(40 MG) BY MOUTH AT BEDTIME, Disp: 90 tablet, Rfl: 1   tamsulosin (FLOMAX) 0.4 MG CAPS capsule, TAKE 1 CAPSULE(0.4 MG)  BY MOUTH DAILY AFTER AND SUPPER, Disp: 90 capsule, Rfl: 1   XIGDUO XR 12-998 MG TB24, TAKE 1 TABLET BY MOUTH EVERY DAY, Disp: 90 tablet, Rfl: 1  Orders Placed This Encounter  Procedures   EKG 12-Lead   ECHOCARDIOGRAM COMPLETE   There are no Patient Instructions on file for this visit.   --Continue cardiac medications as reconciled in final medication list. --Return in about 6 months (around 07/16/2021) for Follow up pericardial effusion. . Or sooner if needed. --Continue follow-up with your primary care physician regarding the  management of your other chronic comorbid conditions.  Patient's questions and concerns were addressed to his satisfaction. He voices understanding of the instructions provided during this encounter.   This note was created using a voice recognition software as a result there may be grammatical errors inadvertently enclosed that do not reflect the nature of this encounter. Every attempt is made to correct such errors.  Tessa Lerner, [place], [facility]  Pager: [phone] Office: [phone]

## 2021-01-16 ENCOUNTER — Ambulatory Visit: Payer: Medicare Other | Admitting: Cardiology

## 2021-03-08 IMAGING — MG DIGITAL DIAGNOSTIC BILAT W/ TOMO W/ CAD
8 series · 8 of 24 positions shown · non-contrast
Comparison: None.

CLINICAL DATA: 75-year-old male with intermittent tenderness of the
bilateral breasts.

EXAM:
DIGITAL DIAGNOSTIC BILATERAL MAMMOGRAM WITH TOMOSYNTHESIS AND CAD
TECHNIQUE: Bilateral digital diagnostic mammography and breast tomosynthesis
was performed. The images were evaluated with computer-aided
detection.

[L MLO synth-2D]
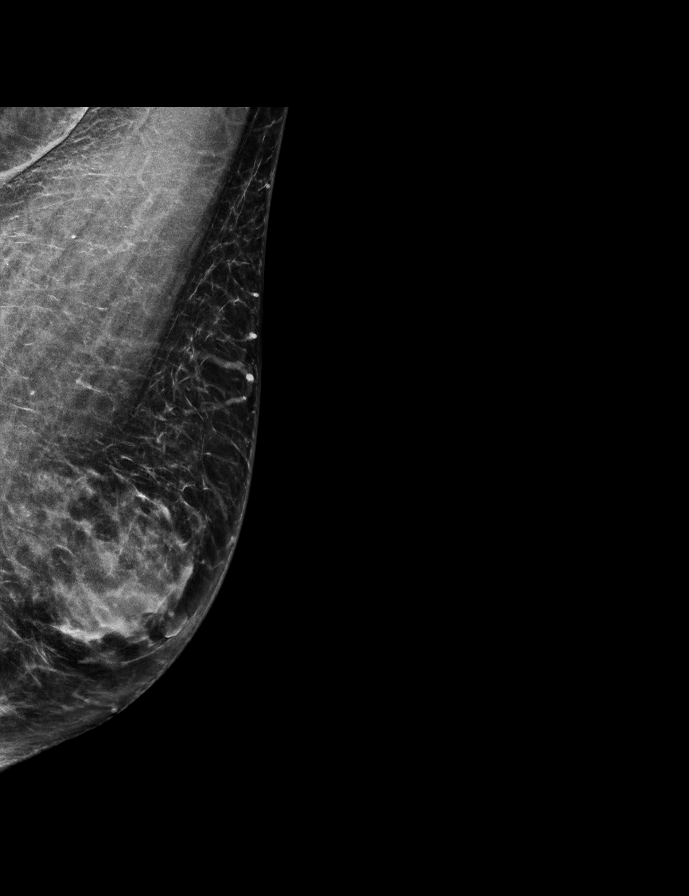

[R MLO synth-2D]
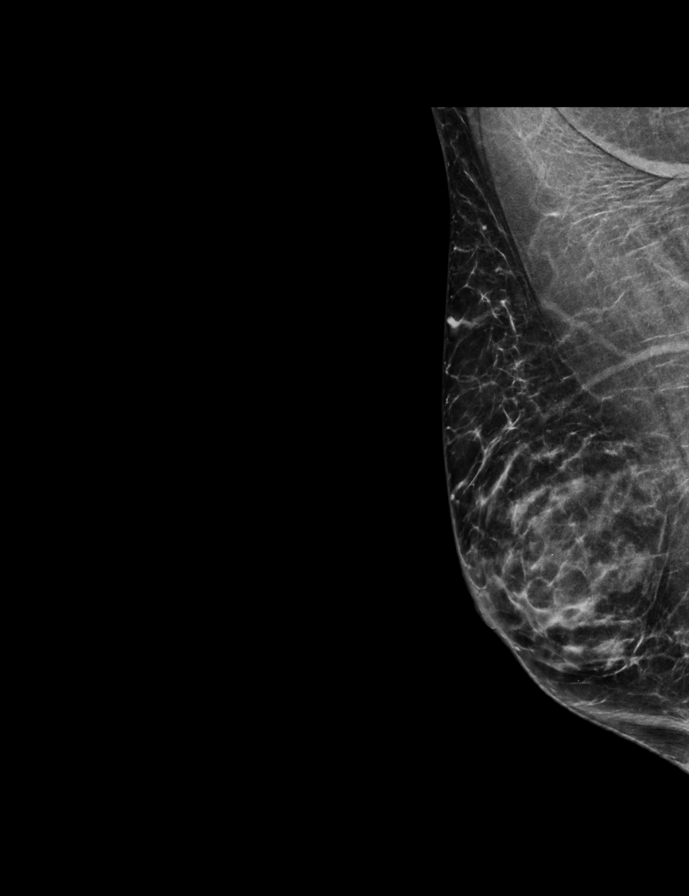

[L CC synth-2D]
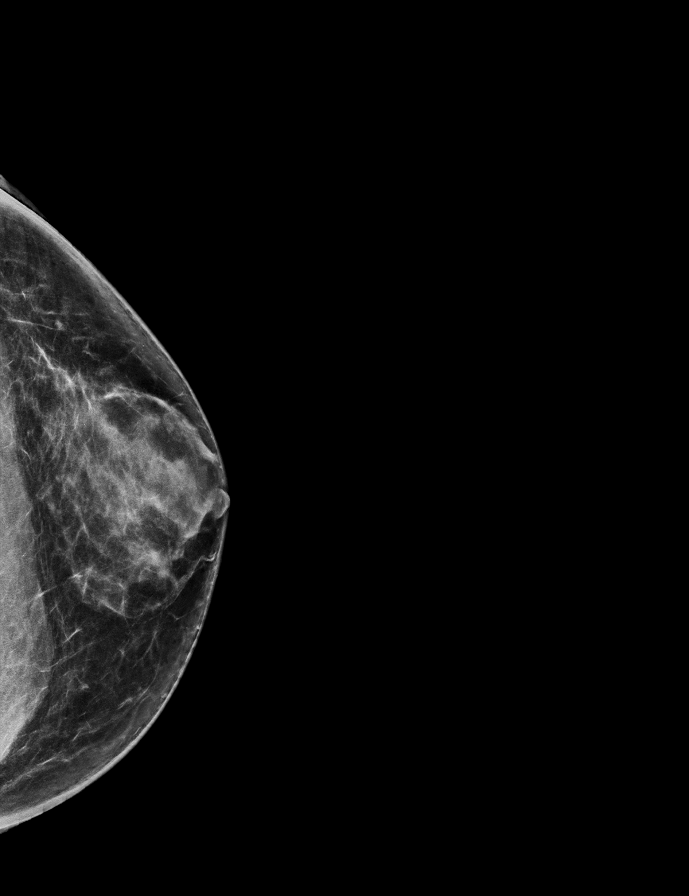

[R CC synth-2D]
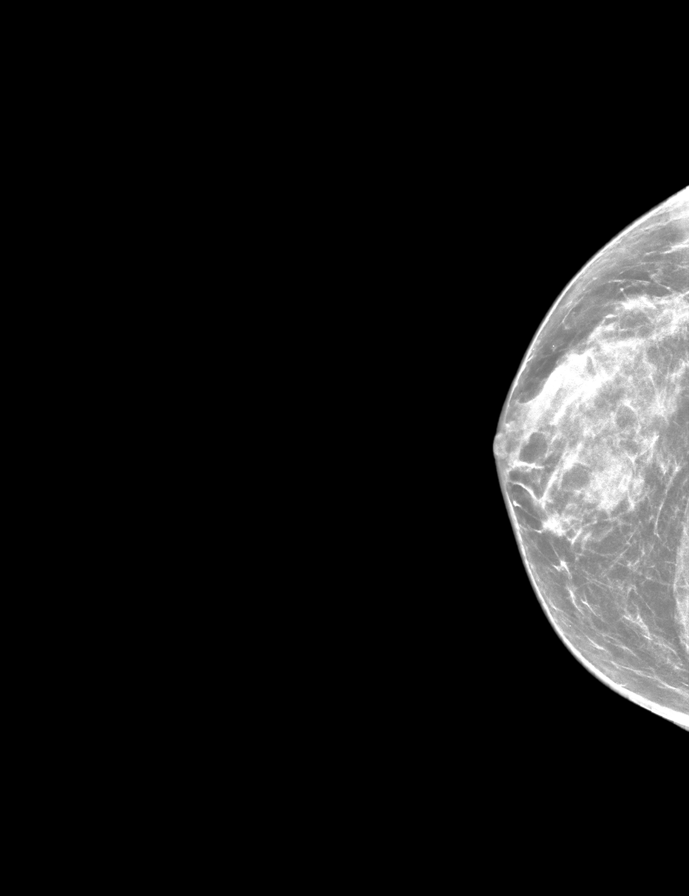

[L CC tomo · tomo slice 31/62.0]
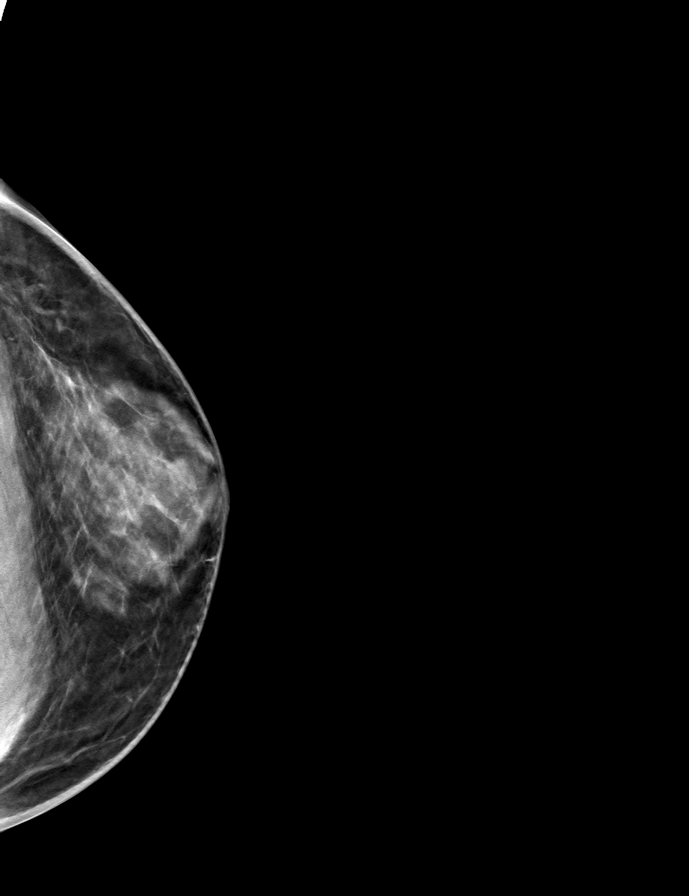

[L MLO tomo · tomo slice 31/61.0]
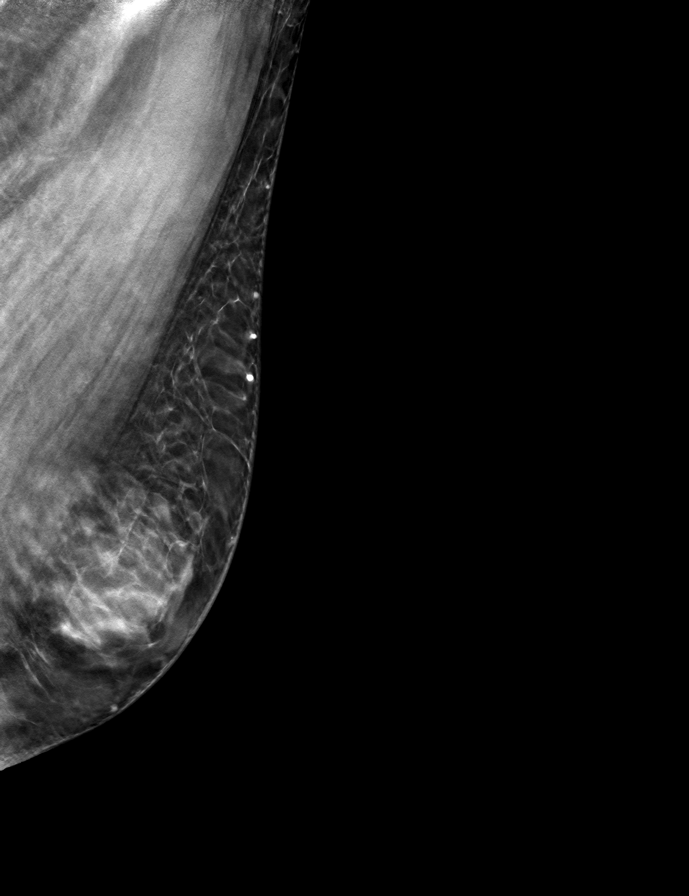

[R CC tomo · tomo slice 27/52.0]
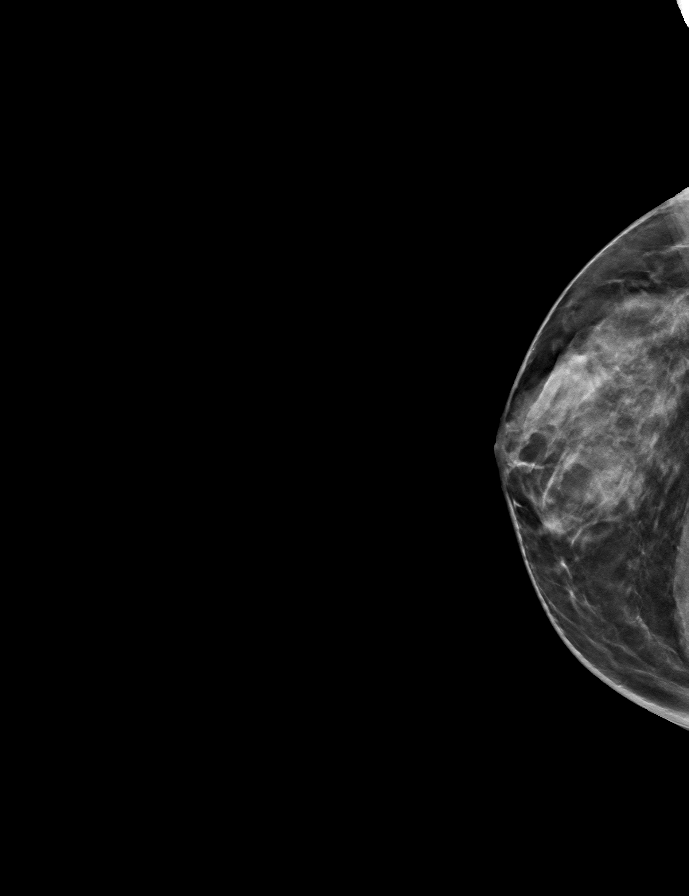

[R MLO tomo · tomo slice 27/53.0]
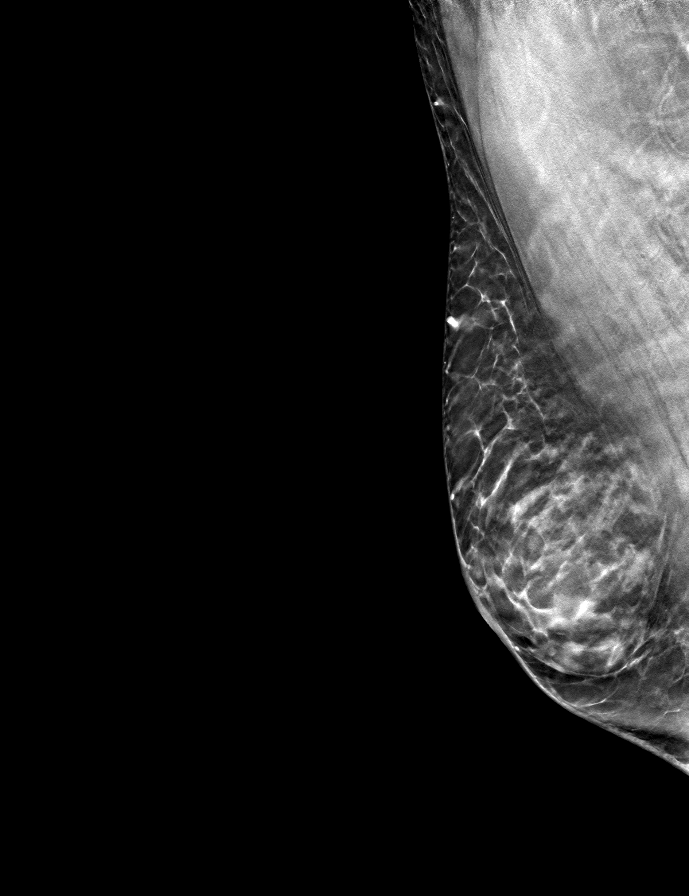

[8 of 24 positions shown; findings below may reference images not displayed]

ACR Breast Density Category c: The breast tissue is heterogeneously
dense, which may obscure small masses.
FINDINGS: There is flame shaped fibroglandular tissue originating at the
nipples and extending posterior bilaterally. This is consistent with
marked bilateral gynecomastia. No suspicious masses identified.
IMPRESSION: Marked bilateral gynecomastia explaining the patient's clinical
symptoms. No suspicious findings identified.

RECOMMENDATION:
I discussed with the patient the fact that gynecomastia can occur in
older men as testosterone levels decrease with age or in younger men
with low testosterone levels, causing a change in the serum
testosterone:estrogen ratio. We also discussed other potential
etiologies of gynecomastia including numerous prescription
medications and recreational drugs (marijuana and anabolic steroids
in particular). I counseled the patient to perform self-examination
to make sure that a hard lump does not form which could indicate
malignancy and would need further evaluation. We also discussed the
possibility of surgical excision if symptoms continue and if an
etiology of the gynecomastia cannot be determined and therefore
corrected.

I have discussed the findings and recommendations with the patient.
If applicable, a reminder letter will be sent to the patient
regarding the next appointment.

BI-RADS CATEGORY  2: Benign.

## 2021-03-29 ENCOUNTER — Other Ambulatory Visit: Payer: Self-pay | Admitting: Internal Medicine

## 2021-03-29 DIAGNOSIS — E119 Type 2 diabetes mellitus without complications: Secondary | ICD-10-CM

## 2021-04-06 ENCOUNTER — Other Ambulatory Visit: Payer: Self-pay | Admitting: Internal Medicine

## 2021-04-06 DIAGNOSIS — E785 Hyperlipidemia, unspecified: Secondary | ICD-10-CM

## 2021-04-12 ENCOUNTER — Other Ambulatory Visit: Payer: Self-pay | Admitting: Cardiology

## 2021-04-12 DIAGNOSIS — I10 Essential (primary) hypertension: Secondary | ICD-10-CM

## 2021-04-12 DIAGNOSIS — I471 Supraventricular tachycardia: Secondary | ICD-10-CM

## 2021-04-22 ENCOUNTER — Telehealth: Payer: Self-pay | Admitting: Internal Medicine

## 2021-04-22 NOTE — Telephone Encounter (Signed)
Appeal auditor w/ [employer] is requesting a copy of 03-19-2020 [facility] referral faxed  Caller states [employer] is filing an appeal on behalf of [facility] for payment of 04-26-2020 services    Case # [id number]  Fax # [phone] Attn: Loren Racer

## 2021-04-24 ENCOUNTER — Other Ambulatory Visit: Payer: Self-pay | Admitting: Cardiology

## 2021-04-24 DIAGNOSIS — E119 Type 2 diabetes mellitus without complications: Secondary | ICD-10-CM

## 2021-04-24 DIAGNOSIS — I10 Essential (primary) hypertension: Secondary | ICD-10-CM

## 2021-05-05 ENCOUNTER — Other Ambulatory Visit: Payer: Self-pay | Admitting: Internal Medicine

## 2021-05-05 DIAGNOSIS — E119 Type 2 diabetes mellitus without complications: Secondary | ICD-10-CM

## 2021-05-23 ENCOUNTER — Other Ambulatory Visit: Payer: Self-pay | Admitting: Internal Medicine

## 2021-05-23 DIAGNOSIS — E119 Type 2 diabetes mellitus without complications: Secondary | ICD-10-CM

## 2021-07-05 ENCOUNTER — Other Ambulatory Visit: Payer: Self-pay | Admitting: Internal Medicine

## 2021-07-05 ENCOUNTER — Other Ambulatory Visit: Payer: Self-pay | Admitting: Cardiology

## 2021-07-05 DIAGNOSIS — I10 Essential (primary) hypertension: Secondary | ICD-10-CM

## 2021-07-05 DIAGNOSIS — N138 Other obstructive and reflux uropathy: Secondary | ICD-10-CM

## 2021-07-05 DIAGNOSIS — I471 Supraventricular tachycardia: Secondary | ICD-10-CM

## 2021-07-16 ENCOUNTER — Ambulatory Visit: Payer: Medicare Other | Admitting: Cardiology

## 2021-08-07 ENCOUNTER — Other Ambulatory Visit: Payer: Self-pay | Admitting: Registered Nurse

## 2021-08-07 ENCOUNTER — Other Ambulatory Visit (HOSPITAL_COMMUNITY): Payer: Self-pay | Admitting: Registered Nurse

## 2021-08-07 DIAGNOSIS — I700 Atherosclerosis of aorta: Secondary | ICD-10-CM

## 2021-08-07 DIAGNOSIS — I110 Hypertensive heart disease with heart failure: Secondary | ICD-10-CM

## 2021-08-14 ENCOUNTER — Other Ambulatory Visit: Payer: Medicare Other

## 2021-08-14 ENCOUNTER — Other Ambulatory Visit: Payer: Self-pay | Admitting: Registered Nurse

## 2021-08-14 DIAGNOSIS — I700 Atherosclerosis of aorta: Secondary | ICD-10-CM

## 2021-08-21 ENCOUNTER — Ambulatory Visit
Admission: RE | Admit: 2021-08-21 | Discharge: 2021-08-21 | Disposition: A | Discharge status: Home / Self Care | Payer: Medicare Other | Source: Ambulatory Visit | Attending: Registered Nurse | Admitting: Registered Nurse

## 2021-08-21 ENCOUNTER — Ambulatory Visit: Payer: Medicare Other | Admitting: Cardiology

## 2021-08-21 ENCOUNTER — Encounter: Payer: Self-pay | Admitting: Cardiology

## 2021-08-21 VITALS — BP 131/61 | HR 59 | Temp 97.3°F | Resp 16 | Ht 75.0 in | Wt 195.6 lb

## 2021-08-21 DIAGNOSIS — I10 Essential (primary) hypertension: Secondary | ICD-10-CM

## 2021-08-21 DIAGNOSIS — I3139 Other pericardial effusion (noninflammatory): Secondary | ICD-10-CM

## 2021-08-21 DIAGNOSIS — R001 Bradycardia, unspecified: Secondary | ICD-10-CM

## 2021-08-21 DIAGNOSIS — I4719 Other supraventricular tachycardia: Secondary | ICD-10-CM

## 2021-08-21 DIAGNOSIS — I471 Supraventricular tachycardia: Secondary | ICD-10-CM

## 2021-08-21 DIAGNOSIS — I700 Atherosclerosis of aorta: Secondary | ICD-10-CM

## 2021-08-21 DIAGNOSIS — Z794 Long term (current) use of insulin: Secondary | ICD-10-CM

## 2021-08-21 DIAGNOSIS — E782 Mixed hyperlipidemia: Secondary | ICD-10-CM

## 2021-08-21 DIAGNOSIS — E119 Type 2 diabetes mellitus without complications: Secondary | ICD-10-CM

## 2021-08-21 NOTE — Progress Notes (Signed)
ID:  Marisa Hua., DOB 10-11-1945, MRN [id number]  PCP:  Arthur Holms, NP  Cardiologist:  Rex Kras, DO, [facility] (established care 04/01/2020)  Date: 08/21/21 Last Office Visit: 01/15/2021  Chief Complaint  Patient presents with   Pericardial Effusion   Follow-up    HPI  Stephen Gray. is a 76 y.o. male whose past medical history and cardiovascular risk factors include: Pericardial effusion, hypertension, Hyperlipidemia, IDDM, Vitamin B12 deficiency, former smoker, advanced age.  Referred to the practice in December 2021 after moving to [place] from [place] to be closer to family.  We were asked to follow his chronic comorbid conditions and evaluation of bradycardia.  Noninvasive cardiovascular work-up as noted GXT which illustrated preserved chronotropic competence and stress ECG negative for ischemia.  Echo noted preserved LVEF with no significant valvular heart disease but at least moderate pericardial effusion without hemodynamic significance.  She was requested to have a repeat echocardiogram prior to today's office visit.  Patient states that he had an echo at an outside facility.  Records were received and summarized below for further reference.  Clinically he denies any anginal discomfort or heart failure symptoms.  FUNCTIONAL STATUS: Active for his age with ADLs and doing daily chores.  However, no structured exercise program or daily routine.  ALLERGIES: Allergies  Allergen Reactions   Shrimp Flavor Anaphylaxis, Hives, Shortness Of Breath, Itching, Nausea And Vomiting, Swelling and Rash    MEDICATION LIST PRIOR TO VISIT: Current Meds  Medication Sig   acetaminophen (TYLENOL) 500 MG tablet Take 2 tablets (1,000 mg total) by mouth every 6 (six) hours.   amLODipine (NORVASC) 5 MG tablet TAKE 1 TABLET(5 MG) BY MOUTH DAILY   Cholecalciferol (VITAMIN D-3) 25 MCG (1000 UT) CAPS Take 1,000 Units by mouth daily.   Continuous Blood Gluc Receiver (FREESTYLE LIBRE 2  READER) DEVI Apply 1 application topically daily.   Continuous Blood Gluc Sensor (FREESTYLE LIBRE 2 SENSOR) MISC Apply 1 application topically daily.   cyclobenzaprine (FLEXERIL) 10 MG tablet SMARTSIG:1 Tablet(s) By Mouth 1-3 Times Daily PRN   Insulin Pen Needle 32G X 6 MM MISC 1 Act by Does not apply route daily.   metoprolol succinate (TOPROL-XL) 25 MG 24 hr tablet TAKE 1 TABLET BY MOUTH DAILY, HOLD IF SYSTOLIC(TOP NUMBER) LESS THAN 100 OR PULSE LESS THAN 60   Multiple Vitamin (MULTIVITAMIN ADULT PO) Take 1 tablet by mouth daily.   simvastatin (ZOCOR) 40 MG tablet TAKE 1 TABLET(40 MG) BY MOUTH AT BEDTIME   tamsulosin (FLOMAX) 0.4 MG CAPS capsule TAKE 1 CAPSULE(0.4 MG) BY MOUTH DAILY AFTER AND SUPPER   TOUJEO MAX SOLOSTAR 300 UNIT/ML Solostar Pen ADMINISTER 20 UNITS UNDER THE SKIN DAILY   valsartan-hydrochlorothiazide (DIOVAN-HCT) 320-25 MG tablet Take 1 tablet by mouth daily.   XIGDUO XR 12-998 MG TB24 TAKE 1 TABLET BY MOUTH EVERY DAY     PAST MEDICAL HISTORY: Past Medical History:  Diagnosis Date   Anxiety    Diabetes ([place])    GERD (gastroesophageal reflux disease)    Hypercholesteremia    Hypertension     PAST SURGICAL HISTORY: Past Surgical History:  Procedure Laterality Date   TESTICULAR EXPLORATION      FAMILY HISTORY: The patient family history includes Diabetes in his mother; Kidney disease in his father.  SOCIAL HISTORY:  The patient  reports that he quit smoking about 21 years ago. His smoking use included cigarettes. He has a 10.00 pack-year smoking history. He has never used smokeless tobacco.  He reports current alcohol use. He reports current drug use. Drug: Marijuana.  REVIEW OF SYSTEMS: Review of Systems  Constitutional: Negative for chills and fever.  HENT:  Positive for hearing loss (decreased hearing, per patient. ). Negative for hoarse voice and nosebleeds.   Eyes:  Negative for blurred vision, discharge, double vision and pain.  Cardiovascular:  Negative  for chest pain, claudication, dyspnea on exertion, leg swelling, near-syncope, orthopnea, palpitations, paroxysmal nocturnal dyspnea and syncope.  Respiratory:  Negative for hemoptysis and shortness of breath.   Musculoskeletal:  Negative for muscle cramps and myalgias.  Gastrointestinal:  Negative for abdominal pain, constipation, diarrhea, hematemesis, hematochezia, melena, nausea and vomiting.  Neurological:  Negative for dizziness and light-headedness.    PHYSICAL EXAM:    08/21/2021   12:54 PM 08/21/2021   12:46 PM 01/15/2021   10:38 AM  Vitals with BMI  Height  6\' 3"    Weight  195 lbs 10 oz   BMI  [id number]   Systolic [id number]  Diastolic 61 84 67  Pulse 59 66 86   CONSTITUTIONAL: Well-developed and well-nourished. No acute distress.  SKIN: Skin is warm and dry. No rash noted. No cyanosis. No pallor. No jaundice HEAD: Normocephalic and atraumatic.  EYES: No scleral icterus MOUTH/THROAT: Moist oral membranes.   NECK: No JVD present. No thyromegaly noted. No carotid bruits  CHEST Normal respiratory effort. No intercostal retractions  LUNGS: Clear to auscultation bilaterally. No stridor. No wheezes. No rales.  CARDIOVASCULAR: Regular rate and rhythm, positive S1-S2, no murmurs rubs or gallops appreciated. ABDOMINAL: No apparent ascites.  EXTREMITIES: No peripheral edema  HEMATOLOGIC: No significant bruising NEUROLOGIC: Oriented to person, place, and time. Nonfocal. Normal muscle tone.  PSYCHIATRIC: Normal mood and affect. Normal behavior. Cooperative  CARDIAC DATABASE: EKG: 08/21/2021: Sinus bradycardia, 52 bpm, normal axis, without underlying injury pattern.  Echocardiogram: 04/11/2020: Normal LV systolic function with visual EF 60-65%. Left ventricle cavity is normal in size. Mild left ventricular hypertrophy. Normal global wall motion. Normal diastolic filling pattern, normal LAP.  Mild tricuspid regurgitation. Moderate pericardial effusion. There is no hemodynamic  significance. No prior study for comparison.  10/28/2020:  1. Left ventricular ejection fraction, by estimation, is 60 to 65%. The  left ventricle has normal function. The left ventricle has no regional wall motion abnormalities. There is mild left ventricular hypertrophy.  Left ventricular diastolic parameters  were normal.   2. Right ventricular systolic function is low normal. The right ventricular size is normal. Mildly increased right ventricular wall thickness. There is normal pulmonary artery systolic pressure.   3. Moderate to large pericardial effusion. The pericardial effusion is circumferential - mostly anterior to the right ventricle. There is no evidence of cardiac tamponade.   4. The mitral valve is grossly normal. No evidence of mitral valve regurgitation. No evidence of mitral stenosis.   5. The aortic valve is tricuspid. Aortic valve regurgitation is not visualized. Mild aortic valve sclerosis is present, with no evidence of aortic valve stenosis.   6. There is mild (Grade II) plaque involving the descending aorta.   7. The inferior vena cava is normal in size with greater than 50% respiratory variability, suggesting right atrial pressure of 3 mmHg.   Comparison(s): Compared to outside echo 04/11/2020 no significant change.   08/13/2018 [place]. health: Per report LVEF 55 to 60%, moderate LVH, indeterminate diastolic filling pattern, mild MR, small to moderate size pericardial effusion.  Stress Testing: Treadmill Exercise Stress 04/12/2020: The patient exercised for 5  minutes and 59 seconds on Bruce protocol; achieved 4.76 METs at 119% of maximum predicted heart rate. Negative for ischemia. Chest pain is not present.  The heart rate response was accelerated. The baseline blood pressure was 144/84 mmHg and increased to 234/88 mmHg at peak exercise, which is a hypertensive response to exercise.  Resting ECG demonstrated normal sinus rhythm. Observed left ventricular hypertrophy  with secondary ST-T changes, repolarization abnormality.  Peak ECG demonstrated no significant ST-T change from baseline abnormality. During exercise the peak ECG revealed nonsustained supraventricular (Atrial) tachycardia and rare premature ventricular contractions. Nonsustained supraventricular tachycardia and occasional premature ventricular contractions noted on ECG at stress.  The heart rate response was accelerated. The blood pressure response was hypertensive.  Heart Catheterization: None  LABORATORY DATA:    Latest Ref Rng & Units 07/30/2020    2:35 PM 03/19/2020    3:25 PM  CBC  WBC 4.0 - 10.5 K/uL 10.7  8.0   Hemoglobin 13.0 - 17.0 g/dL 13.1  12.3   Hematocrit 39.0 - 52.0 % 39.0  36.1   Platelets 150 - 400 K/uL 245  258.0        Latest Ref Rng & Units 10/22/2020    1:55 PM 07/31/2020    3:39 AM 07/30/2020    2:35 PM  CMP  Glucose 70 - 99 mg/dL 205  279  423   BUN 6 - 23 mg/dL 12  19  20    Creatinine 0.40 - 1.50 mg/dL 0.88  0.94  0.93   Sodium 135 - 145 mEq/L 139  136  138   Potassium 3.5 - 5.1 mEq/L 3.6  3.3  3.2   Chloride 96 - 112 mEq/L 99  98  97   CO2 19 - 32 mEq/L 30  28  27    Calcium 8.4 - 10.5 mg/dL 9.4  8.9  9.0   Total Protein 6.5 - 8.1 g/dL   7.1   Total Bilirubin 0.3 - 1.2 mg/dL   0.5   Alkaline Phos 38 - 126 U/L   77   AST 15 - 41 U/L   22   ALT 0 - 44 U/L   16     Lipid Panel     Component Value Date/Time   CHOL 152 03/19/2020 1525   TRIG 101.0 03/19/2020 1525   HDL 62.50 03/19/2020 1525   CHOLHDL 2 03/19/2020 1525   VLDL 20.2 03/19/2020 1525   LDLCALC 69 03/19/2020 1525    No components found for: "NTPROBNP" No results for input(s): "PROBNP" in the last 8760 hours. Recent Labs    10/22/20 1355  TSH 0.92    BMP Recent Labs    10/22/20 1355  NA 139  K 3.6  CL 99  CO2 30  GLUCOSE 205*  BUN 12  CREATININE 0.88  CALCIUM 9.4    HEMOGLOBIN A1C Lab Results  Component Value Date   HGBA1C 8.9 (H) 10/22/2020   MPG 254.65 07/31/2020     IMPRESSION:    ICD-10-CM   1. Pericardial effusion  I31.39 EKG 12-Lead    2. Atrial tachycardia (HCC)  I47.1     3. Benign hypertension  I10     4. Type 2 diabetes mellitus with hyperglycemia, with long-term current use of insulin (HCC)  E11.65    Z79.4     5. Mixed hyperlipidemia  E78.2        RECOMMENDATIONS: Stephen Gray. is a 76 y.o. male whose past medical history and cardiac risk factors  include: Pericardial effusion, hypertension, Hyperlipidemia, IDDM, Vitamin B12 deficiency, former smoker, advanced age.  Pericardial effusion Patient has a known history of moderate to large pericardial effusion as of August 2022.  He was supposed to have a repeat echocardiogram prior to today's office visit.  It appears he had at an outside facility at [place] were obtained as part of today's office visit.  As per the report he continues to have pericardial effusion but the size is small to moderate.  I do not have the images to review the study personally.  Atrial tachycardia (HCC) Currently on metoprolol 25 mg p.o. daily. Ventricular rate is well controlled. Currently asymptomatic  Benign hypertension Office blood pressures are well controlled. Medications reconciled. Reemphasized the importance of low-salt diet.  FINAL MEDICATION LIST END OF ENCOUNTER:  Current Outpatient Medications:    acetaminophen (TYLENOL) 500 MG tablet, Take 2 tablets (1,000 mg total) by mouth every 6 (six) hours., Disp: 30 tablet, Rfl: 0   amLODipine (NORVASC) 5 MG tablet, TAKE 1 TABLET(5 MG) BY MOUTH DAILY, Disp: 90 tablet, Rfl: 0   Cholecalciferol (VITAMIN D-3) 25 MCG (1000 UT) CAPS, Take 1,000 Units by mouth daily., Disp: , Rfl:    Continuous Blood Gluc Receiver (FREESTYLE LIBRE 2 READER) DEVI, Apply 1 application topically daily., Disp: 2 each, Rfl: 5   Continuous Blood Gluc Sensor (FREESTYLE LIBRE 2 SENSOR) MISC, Apply 1 application topically daily., Disp: 2 each, Rfl: 5    cyclobenzaprine (FLEXERIL) 10 MG tablet, SMARTSIG:1 Tablet(s) By Mouth 1-3 Times Daily PRN, Disp: , Rfl:    Insulin Pen Needle 32G X 6 MM MISC, 1 Act by Does not apply route daily., Disp: 100 each, Rfl: 1   metoprolol succinate (TOPROL-XL) 25 MG 24 hr tablet, TAKE 1 TABLET BY MOUTH DAILY, HOLD IF SYSTOLIC(TOP NUMBER) LESS THAN 100 OR PULSE LESS THAN 60, Disp: 90 tablet, Rfl: 0   Multiple Vitamin (MULTIVITAMIN ADULT PO), Take 1 tablet by mouth daily., Disp: , Rfl:    simvastatin (ZOCOR) 40 MG tablet, TAKE 1 TABLET(40 MG) BY MOUTH AT BEDTIME, Disp: 90 tablet, Rfl: 1   tamsulosin (FLOMAX) 0.4 MG CAPS capsule, TAKE 1 CAPSULE(0.4 MG) BY MOUTH DAILY AFTER AND SUPPER, Disp: 90 capsule, Rfl: 1   TOUJEO MAX SOLOSTAR 300 UNIT/ML Solostar Pen, ADMINISTER 20 UNITS UNDER THE SKIN DAILY, Disp: 6 mL, Rfl: 0   valsartan-hydrochlorothiazide (DIOVAN-HCT) 320-25 MG tablet, Take 1 tablet by mouth daily., Disp: , Rfl:    XIGDUO XR 12-998 MG TB24, TAKE 1 TABLET BY MOUTH EVERY DAY, Disp: 90 tablet, Rfl: 1  Orders Placed This Encounter  Procedures   EKG 12-Lead   There are no Patient Instructions on file for this visit.   --Continue cardiac medications as reconciled in final medication list. --Return in about 6 months (around 02/20/2022) for Follow up pericardial effusion. Or sooner if needed. --Continue follow-up with your primary care physician regarding the management of your other chronic comorbid conditions.  Patient's questions and concerns were addressed to his satisfaction. He voices understanding of the instructions provided during this encounter.   This note was created using a voice recognition software as a result there may be grammatical errors inadvertently enclosed that do not reflect the nature of this encounter. Every attempt is made to correct such errors.  Rex Kras, [place], [facility]  Pager: [phone] Office: [phone]

## 2021-08-22 ENCOUNTER — Ambulatory Visit (INDEPENDENT_AMBULATORY_CARE_PROVIDER_SITE_OTHER): Payer: Medicare Other | Admitting: Podiatry

## 2021-08-22 ENCOUNTER — Encounter: Payer: Self-pay | Admitting: Podiatry

## 2021-08-22 DIAGNOSIS — M79674 Pain in right toe(s): Secondary | ICD-10-CM

## 2021-08-22 DIAGNOSIS — B351 Tinea unguium: Secondary | ICD-10-CM | POA: Diagnosis not present

## 2021-08-22 DIAGNOSIS — M79675 Pain in left toe(s): Secondary | ICD-10-CM

## 2021-08-22 DIAGNOSIS — E1151 Type 2 diabetes mellitus with diabetic peripheral angiopathy without gangrene: Secondary | ICD-10-CM | POA: Diagnosis not present

## 2021-08-22 NOTE — Progress Notes (Signed)
This patient returns to my office for at risk foot care.  This patient requires this care by a professional since this patient will be at risk due to having  diabetes. This patient is unable to cut nails himself since the patient cannot reach his nails.These nails are painful walking and wearing shoes.  This patient presents for at risk foot care today.  General Appearance  Alert, conversant and in no acute stress.  Vascular  Dorsalis pedis and posterior tibial  pulses are weakly palpable  bilaterally.  Capillary return is within normal limits  bilaterally. Temperature is within normal limits  bilaterally.  Neurologic  Senn-Weinstein monofilament wire test within normal limits  bilaterally. Muscle power within normal limits bilaterally.  Nails Thick disfigured discolored nails with subungual debris  from hallux to fifth toes bilaterally. No evidence of bacterial infection or drainage bilaterally.  Orthopedic  No limitations of motion  feet .  No crepitus or effusions noted.  No bony pathology or digital deformities noted. HAV  B/L.  Skin  normotropic skin with no porokeratosis noted bilaterally.  No signs of infections or ulcers noted.     Onychomycosis  Pain in right toes  Pain in left toes  Consent was obtained for treatment procedures.   Mechanical debridement of nails 1-5  bilaterally performed with a nail nipper.  Filed with dremel without incident.    Return office visit     4 months                 Told patient to return for periodic foot care and evaluation due to potential at risk complications.   Helane Gunther DPM

## 2021-09-05 ENCOUNTER — Other Ambulatory Visit: Payer: Self-pay | Admitting: Family

## 2021-09-05 ENCOUNTER — Ambulatory Visit
Admission: RE | Admit: 2021-09-05 | Discharge: 2021-09-05 | Disposition: A | Discharge status: Home / Self Care | Payer: Medicare Other | Source: Ambulatory Visit | Attending: Family | Admitting: Family

## 2021-09-05 DIAGNOSIS — R0781 Pleurodynia: Secondary | ICD-10-CM

## 2021-09-26 ENCOUNTER — Other Ambulatory Visit: Payer: Self-pay | Admitting: Registered Nurse

## 2021-09-26 ENCOUNTER — Ambulatory Visit
Admission: RE | Admit: 2021-09-26 | Discharge: 2021-09-26 | Disposition: A | Discharge status: Home / Self Care | Payer: Medicare Other | Source: Ambulatory Visit | Attending: Registered Nurse | Admitting: Registered Nurse

## 2021-09-26 DIAGNOSIS — M542 Cervicalgia: Secondary | ICD-10-CM

## 2021-10-03 ENCOUNTER — Other Ambulatory Visit: Payer: Self-pay | Admitting: Cardiology

## 2021-10-03 ENCOUNTER — Other Ambulatory Visit: Payer: Self-pay | Admitting: Internal Medicine

## 2021-10-03 DIAGNOSIS — I471 Supraventricular tachycardia: Secondary | ICD-10-CM

## 2021-10-03 DIAGNOSIS — I10 Essential (primary) hypertension: Secondary | ICD-10-CM

## 2021-10-03 DIAGNOSIS — E785 Hyperlipidemia, unspecified: Secondary | ICD-10-CM

## 2021-10-23 ENCOUNTER — Other Ambulatory Visit: Payer: Self-pay | Admitting: Internal Medicine

## 2021-10-23 DIAGNOSIS — E785 Hyperlipidemia, unspecified: Secondary | ICD-10-CM

## 2021-11-10 ENCOUNTER — Ambulatory Visit (HOSPITAL_COMMUNITY)
Admission: RE | Admit: 2021-11-10 | Discharge: 2021-11-10 | Disposition: A | Discharge status: Home / Self Care | Payer: Medicare Other | Source: Ambulatory Visit | Attending: Cardiology | Admitting: Cardiology

## 2021-11-10 DIAGNOSIS — I3139 Other pericardial effusion (noninflammatory): Secondary | ICD-10-CM | POA: Insufficient documentation

## 2021-12-07 DIAGNOSIS — I3139 Other pericardial effusion (noninflammatory): Secondary | ICD-10-CM | POA: Insufficient documentation

## 2021-12-07 LAB — ECHOCARDIOGRAM COMPLETE
Area-P 1/2: 2.46 cm2
S' Lateral: 3.2 cm

## 2021-12-07 NOTE — Addendum Note (Signed)
Encounter addended by: Rex Kras, DO on: 12/07/2021 6:42 PM  Actions taken: Charge Capture section accepted, Problem List modified

## 2021-12-26 ENCOUNTER — Ambulatory Visit: Payer: Medicare Other | Admitting: Podiatry

## 2022-01-01 ENCOUNTER — Other Ambulatory Visit: Payer: Self-pay | Admitting: Cardiology

## 2022-01-01 ENCOUNTER — Other Ambulatory Visit: Payer: Self-pay | Admitting: Internal Medicine

## 2022-01-01 DIAGNOSIS — N401 Enlarged prostate with lower urinary tract symptoms: Secondary | ICD-10-CM

## 2022-01-01 DIAGNOSIS — I4719 Other supraventricular tachycardia: Secondary | ICD-10-CM

## 2022-01-01 DIAGNOSIS — I10 Essential (primary) hypertension: Secondary | ICD-10-CM

## 2022-01-27 ENCOUNTER — Emergency Department (HOSPITAL_COMMUNITY): Payer: Medicare Other

## 2022-01-27 ENCOUNTER — Encounter (HOSPITAL_COMMUNITY): Payer: Self-pay

## 2022-01-27 ENCOUNTER — Other Ambulatory Visit: Payer: Self-pay

## 2022-01-27 ENCOUNTER — Emergency Department (HOSPITAL_COMMUNITY)
Admission: EM | Admit: 2022-01-27 | Discharge: 2022-01-28 | Disposition: A | Discharge status: Home / Self Care | Payer: Medicare Other | Attending: Emergency Medicine | Admitting: Emergency Medicine

## 2022-01-27 DIAGNOSIS — S0990XA Unspecified injury of head, initial encounter: Secondary | ICD-10-CM | POA: Diagnosis not present

## 2022-01-27 DIAGNOSIS — Z23 Encounter for immunization: Secondary | ICD-10-CM | POA: Insufficient documentation

## 2022-01-27 DIAGNOSIS — Z79899 Other long term (current) drug therapy: Secondary | ICD-10-CM | POA: Diagnosis not present

## 2022-01-27 DIAGNOSIS — S50312A Abrasion of left elbow, initial encounter: Secondary | ICD-10-CM | POA: Diagnosis not present

## 2022-01-27 DIAGNOSIS — Z794 Long term (current) use of insulin: Secondary | ICD-10-CM | POA: Diagnosis not present

## 2022-01-27 DIAGNOSIS — M25571 Pain in right ankle and joints of right foot: Secondary | ICD-10-CM | POA: Insufficient documentation

## 2022-01-27 DIAGNOSIS — Z7984 Long term (current) use of oral hypoglycemic drugs: Secondary | ICD-10-CM | POA: Insufficient documentation

## 2022-01-27 DIAGNOSIS — I10 Essential (primary) hypertension: Secondary | ICD-10-CM | POA: Diagnosis not present

## 2022-01-27 DIAGNOSIS — E119 Type 2 diabetes mellitus without complications: Secondary | ICD-10-CM | POA: Insufficient documentation

## 2022-01-27 DIAGNOSIS — Y92410 Unspecified street and highway as the place of occurrence of the external cause: Secondary | ICD-10-CM | POA: Insufficient documentation

## 2022-01-27 DIAGNOSIS — S4992XA Unspecified injury of left shoulder and upper arm, initial encounter: Secondary | ICD-10-CM | POA: Diagnosis present

## 2022-01-27 DIAGNOSIS — T07XXXA Unspecified multiple injuries, initial encounter: Secondary | ICD-10-CM
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
## 2022-01-27 NOTE — ED Notes (Signed)
Patient requesting pain meds. Triage RN Bobby notified 
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
## 2022-01-28 ENCOUNTER — Emergency Department (HOSPITAL_COMMUNITY): Payer: Medicare Other

## 2022-01-28 MED ORDER — IBUPROFEN 400 MG PO TABS
400.0000 mg | ORAL_TABLET | Freq: Once | ORAL | Status: AC
  Administered 2022-01-28: 400 mg via ORAL
  Filled 2022-01-28: qty 1

## 2022-01-28 MED ORDER — ACETAMINOPHEN 325 MG PO TABS
650.0000 mg | ORAL_TABLET | Freq: Once | ORAL | Status: AC
  Administered 2022-01-28: 650 mg via ORAL
  Filled 2022-01-28: qty 2

## 2022-01-28 MED ORDER — NAPROXEN 375 MG PO TABS: 375.0000 mg | ORAL_TABLET | Freq: Two times a day (BID) | ORAL | 0 refills | Status: AC

## 2022-01-28 MED ORDER — TETANUS-DIPHTH-ACELL PERTUSSIS 5-2.5-18.5 LF-MCG/0.5 IM SUSY
0.5000 mL | PREFILLED_SYRINGE | Freq: Once | INTRAMUSCULAR | Status: AC
  Administered 2022-01-28: 0.5 mL via INTRAMUSCULAR
  Filled 2022-01-28: qty 0.5
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
## 2022-01-28 NOTE — Discharge Instructions (Addendum)
Apply ice to sore areas.  Ice to be applied for 30 minutes at a time, 4 times a day.  Take the prescribed naproxen.  For additional pain relief, add acetaminophen.  When you combine acetaminophen with naproxen, you get better pain relief and you get from taking either medication by itself.  If you are having ongoing problems with your knees or ankle or shoulder, follow-up with the orthopedic physician.

## 2022-02-20 ENCOUNTER — Ambulatory Visit: Payer: Medicare Other | Admitting: Cardiology

## 2022-04-24 ENCOUNTER — Other Ambulatory Visit: Payer: Self-pay | Admitting: Internal Medicine

## 2022-04-24 DIAGNOSIS — E119 Type 2 diabetes mellitus without complications: Secondary | ICD-10-CM

## 2022-04-30 ENCOUNTER — Other Ambulatory Visit: Payer: Self-pay | Admitting: Cardiology

## 2022-04-30 DIAGNOSIS — I10 Essential (primary) hypertension: Secondary | ICD-10-CM

## 2022-04-30 DIAGNOSIS — I4719 Other supraventricular tachycardia: Secondary | ICD-10-CM

## 2022-07-16 ENCOUNTER — Other Ambulatory Visit: Payer: Self-pay | Admitting: Internal Medicine

## 2022-07-16 DIAGNOSIS — E119 Type 2 diabetes mellitus without complications: Secondary | ICD-10-CM

## 2022-10-08 ENCOUNTER — Other Ambulatory Visit: Payer: Self-pay | Admitting: Cardiology

## 2022-10-08 DIAGNOSIS — I4719 Other supraventricular tachycardia: Secondary | ICD-10-CM

## 2022-10-08 DIAGNOSIS — I10 Essential (primary) hypertension: Secondary | ICD-10-CM

## 2022-11-18 ENCOUNTER — Other Ambulatory Visit: Payer: Self-pay | Admitting: Cardiology

## 2022-11-18 DIAGNOSIS — I4719 Other supraventricular tachycardia: Secondary | ICD-10-CM

## 2022-11-18 DIAGNOSIS — I10 Essential (primary) hypertension: Secondary | ICD-10-CM

## 2022-11-18 LAB — AMB RESULTS CONSOLE CBG: Glucose: 162

## 2022-11-18 LAB — HEMOGLOBIN A1C: Hemoglobin A1C: 8

## 2022-11-18 NOTE — Progress Notes (Signed)
Pt declined SDOH. Pt has PCP with [facility].

## 2022-11-27 ENCOUNTER — Other Ambulatory Visit: Payer: Self-pay

## 2022-12-24 NOTE — Progress Notes (Signed)
The patient attended 11/18/22 screening event where his BP screening results was 114/68 and Blood glucose was 162mg /dl. His A1C was 8.0. At the event the patient noted his pcp was at [facility]. Pt also noted that he used to smoke but no longer does. Patient declined the SDOH questionnaire.   Chart review did not indicate much, however it suggested that pt may be refusing medications. Chart review did not indicate no future appts.  CG called number in the system and the number provided on the consent form. A VM was left on the number on the form and the VM was not set up for the number in the system. CG will make one more call attempt.  CG called pt on 10/15. The pt did not answer and a VM was left. CG will mail send abn unable to contact letter. CG will mail out informational resources regarding how to manage blood sugar.  A sixty day follow up will be done to verify pcp status and dr name? See if the pt has insurance and possibly schedule an appt.  Also see if and why the pt is refusing medications for sugar with such high numbers. Expressing to pt how important regulating his sugar is.

## 2023-02-23 ENCOUNTER — Encounter: Payer: Self-pay | Admitting: *Deleted

## 2023-02-23 NOTE — Progress Notes (Unsigned)
Pt attended 11/18/22 screening event where his b/p was 114/68 and his blood sugar was 162 and his A1C was 8. At the event, the pt confirmed his PCP was [facility], and he did not identify any SDOH insecurities. During initial event f/u and again during this 60 day f/u, both health equity team members were unable to contact pt by phone. However, today, health equity team member was able to confirm with [facility] [address] that pt is still seeing PCP listed in chart Loura Back, and his last appt was "the end of October, 2024." Of note, [employer] encounters, labs, and med refills and f/u are not visible in [facility]. No additional health equity team support indicated at this time.

## 2024-04-03 ENCOUNTER — Other Ambulatory Visit: Payer: Self-pay

## 2024-04-03 ENCOUNTER — Ambulatory Visit: Admission: RE | Admit: 2024-04-03 | Discharge: 2024-04-03 | Disposition: A | Source: Ambulatory Visit

## 2024-04-03 DIAGNOSIS — M25512 Pain in left shoulder: Secondary | ICD-10-CM
